# Patient Record
Sex: Male | Born: 1965 | Race: Black or African American | Hispanic: No | Marital: Married | Smoking: Never smoker
Health system: Southern US, Community
[De-identification: ages and names within clinical notes are randomized; demographics above are authoritative.]

## PROBLEM LIST (undated history)

## (undated) DIAGNOSIS — E7800 Pure hypercholesterolemia, unspecified: Secondary | ICD-10-CM

## (undated) HISTORY — PX: OTHER SURGICAL HISTORY: SHX169

---

## 2004-03-25 ENCOUNTER — Emergency Department (HOSPITAL_COMMUNITY): Admission: EM | Admit: 2004-03-25 | Discharge: 2004-03-25 | Payer: Self-pay | Admitting: *Deleted

## 2004-03-26 ENCOUNTER — Ambulatory Visit (HOSPITAL_COMMUNITY): Admission: RE | Admit: 2004-03-26 | Discharge: 2004-03-26 | Payer: Self-pay | Admitting: Urology

## 2004-10-27 ENCOUNTER — Emergency Department (HOSPITAL_COMMUNITY): Admission: EM | Admit: 2004-10-27 | Discharge: 2004-10-27 | Payer: Self-pay | Admitting: Emergency Medicine

## 2004-11-05 ENCOUNTER — Emergency Department (HOSPITAL_COMMUNITY): Admission: EM | Admit: 2004-11-05 | Discharge: 2004-11-05 | Payer: Self-pay | Admitting: Emergency Medicine

## 2005-08-26 ENCOUNTER — Ambulatory Visit (HOSPITAL_COMMUNITY): Admission: RE | Admit: 2005-08-26 | Discharge: 2005-08-26 | Payer: Self-pay | Admitting: Family Medicine

## 2007-02-18 ENCOUNTER — Emergency Department (HOSPITAL_COMMUNITY): Admission: EM | Admit: 2007-02-18 | Discharge: 2007-02-18 | Payer: Self-pay | Admitting: Emergency Medicine

## 2009-01-25 ENCOUNTER — Ambulatory Visit (HOSPITAL_COMMUNITY): Admission: RE | Admit: 2009-01-25 | Discharge: 2009-01-25 | Payer: Self-pay | Admitting: Internal Medicine

## 2011-03-06 LAB — BASIC METABOLIC PANEL
BUN: 12
CO2: 29
Calcium: 9.3
Chloride: 101
Creatinine, Ser: 1.21
GFR calc Af Amer: >60
GFR calc non Af Amer: >60
Glucose, Bld: 94
Potassium: 3.7
Sodium: 137

## 2011-03-06 LAB — POCT CARDIAC MARKERS
CKMB, poc: 1.1
CKMB, poc: 1.6
Myoglobin, poc: 104
Myoglobin, poc: 66.7
Operator id: 211291
Operator id: 211291
Troponin i, poc: <0.05
Troponin i, poc: <0.05

## 2011-12-29 ENCOUNTER — Ambulatory Visit (INDEPENDENT_AMBULATORY_CARE_PROVIDER_SITE_OTHER): Payer: BC Managed Care – PPO | Admitting: Gastroenterology

## 2011-12-29 ENCOUNTER — Other Ambulatory Visit: Payer: Self-pay | Admitting: Gastroenterology

## 2011-12-29 ENCOUNTER — Encounter: Payer: Self-pay | Admitting: Gastroenterology

## 2011-12-29 VITALS — BP 109/68 | HR 56 | Temp 98.4°F | Ht 64.0 in | Wt 161.6 lb

## 2011-12-29 DIAGNOSIS — K625 Hemorrhage of anus and rectum: Secondary | ICD-10-CM

## 2011-12-29 MED ORDER — PEG 3350-KCL-NA BICARB-NACL 420 G PO SOLR: ORAL | Status: AC

## 2011-12-29 NOTE — Progress Notes (Signed)
Referring Provider: Colette Ribas, MD Primary Care Physician:  Colette Ribas, MD Primary Gastroenterologist:  Dr. Darrick Penna   Chief Complaint  Patient presents with  . Rectal Bleeding    HPI:   Pleasant 46 year old male presenting for initial screening colonoscopy at request of Dwyane Luo, PA/Dr. Phillips Odor. Routine well exam through PCP with findings of heme +stool. Noticed paper hematochezia in past. Recently noticed moderate amount hematochezia in toilet, paper. Unsure if any rectal pain, discomfort when it occurred. No abdominal pain. Denies change in bowel habits. Drinks coffee in mornings, which helps. BM usually daily. No constipation or diarrhea. No weight loss. No loss of appetite. No FH of colon cancer. No upper GI symptoms.    No past medical history on file. (NO PERTINENT PMH)  Past Surgical History  Procedure Date  . Lipoma removal     Current Outpatient Prescriptions  Medication Sig Dispense Refill  . chlorpheniramine-HYDROcodone (TUSSIONEX) 10-8 MG/5ML LQCR Take 5 mLs by mouth at bedtime as needed.       . polyethylene glycol-electrolytes (TRILYTE) 420 G solution Use as directed Also buy 1 fleet enema & 4 dulcolax tablets to use as directed  4000 mL  0    Allergies as of 12/29/2011  . (No Known Allergies)    Family History  Problem Relation Age of Onset  . Colon cancer Neg Hx     History   Social History  . Marital Status: Married    Spouse Name: N/A    Number of Children: N/A  . Years of Education: N/A   Occupational History  .  [employer]   Social History Main Topics  . Smoking status: Never Smoker   . Smokeless tobacco: Not on file  . Alcohol Use: No  . Drug Use: No  . Sexually Active: Not on file   Other Topics Concern  . Not on file   Social History Narrative  . No narrative on file    Review of Systems: Gen: Denies any fever, chills, loss of appetite, fatigue, weight loss. CV: Denies chest pain, heart palpitations,  syncope, peripheral edema. Resp: Denies shortness of breath with rest, cough, wheezing GI: Denies dysphagia or odynophagia. Denies hematemesis, fecal incontinence, or jaundice.  GU : Denies urinary burning, urinary frequency, urinary incontinence.  MS: Denies joint pain, muscle weakness, cramps, limited movement Derm: Denies rash, itching, dry skin Psych: Denies depression, anxiety, confusion or memory loss  Heme: Denies bruising, bleeding, and enlarged lymph nodes.  Physical Exam: BP 109/68  Pulse 56  Temp 98.4 F (36.9 C) (Temporal)  Ht 5\' 4"  (1.626 m)  Wt 161 lb 9.6 oz (73.301 kg)  BMI 27.74 kg/m2 General:   Alert and oriented. Well-developed, well-nourished, pleasant and cooperative. Head:  Normocephalic and atraumatic. Eyes:  Conjunctiva pink, sclera clear, no icterus.    Ears:  Normal auditory acuity. Nose:  No deformity, discharge,  or lesions. Mouth:  No deformity or lesions, mucosa pink and moist.  Neck:  Supple, without mass or thyromegaly. Lungs:  Clear to auscultation bilaterally, without wheezing, rales, or rhonchi.  Heart:  S1, S2 present without murmurs noted.  Abdomen:  +BS, soft, non-tender and non-distended. Without mass or HSM. No rebound or guarding. No hernias noted. Rectal:  Deferred  Msk:  Symmetrical without gross deformities. Normal posture. Extremities:  Without clubbing or edema. Neurologic:  Alert and  oriented x4;  grossly normal neurologically. Skin:  Intact, warm and dry without significant lesions or rashes Cervical Nodes:  No significant cervical adenopathy. Psych:  Alert and cooperative. Normal mood and affect.

## 2011-12-29 NOTE — Assessment & Plan Note (Signed)
46 year old male with recent low-volume hematochezia. Heme +stool at well visit through PCP. Denies any change in bowel habits, abdominal pain, wt loss, lack of appetite, constipation, diarrhea. No prior colonoscopy. No upper GI symptoms. Likely benign anorectal source. However, just slightly overdue for screening colonoscopy. No FH of colon cancer.   Proceed with colonoscopy with Dr. Darrick Penna in the near future. The risks, benefits, and alternatives have been discussed in detail with the patient. They state understanding and desire to proceed.  Also discussed with pt need for family members, siblings to begin screening at age 89 as well. He was unaware of this but appreciative.

## 2011-12-29 NOTE — Progress Notes (Signed)
Faxed to PCP

## 2011-12-29 NOTE — Patient Instructions (Addendum)
We have set you up for a colonoscopy with Dr. Fields in the near future. Further recommendations to follow once this is completed.  

## 2012-01-05 ENCOUNTER — Other Ambulatory Visit: Payer: Self-pay | Admitting: Gastroenterology

## 2012-01-05 DIAGNOSIS — K625 Hemorrhage of anus and rectum: Secondary | ICD-10-CM

## 2012-01-27 ENCOUNTER — Ambulatory Visit: Admit: 2012-01-27 | Payer: BC Managed Care – PPO | Admitting: Gastroenterology

## 2012-01-27 SURGERY — COLONOSCOPY
Anesthesia: Moderate Sedation

## 2012-01-30 ENCOUNTER — Encounter (HOSPITAL_COMMUNITY): Payer: Self-pay | Admitting: Pharmacy Technician

## 2012-02-10 ENCOUNTER — Encounter (HOSPITAL_COMMUNITY)
Admission: RE | Disposition: A | Discharge status: Home / Self Care | Payer: Self-pay | Source: Ambulatory Visit | Attending: Gastroenterology

## 2012-02-10 ENCOUNTER — Ambulatory Visit (HOSPITAL_COMMUNITY)
Admission: RE | Admit: 2012-02-10 | Discharge: 2012-02-10 | Disposition: A | Discharge status: Home / Self Care | Payer: BC Managed Care – PPO | Source: Ambulatory Visit | Attending: Gastroenterology | Admitting: Gastroenterology

## 2012-02-10 ENCOUNTER — Encounter (HOSPITAL_COMMUNITY): Payer: Self-pay | Admitting: *Deleted

## 2012-02-10 DIAGNOSIS — K625 Hemorrhage of anus and rectum: Secondary | ICD-10-CM | POA: Insufficient documentation

## 2012-02-10 DIAGNOSIS — K620 Anal polyp: Secondary | ICD-10-CM

## 2012-02-10 DIAGNOSIS — D126 Benign neoplasm of colon, unspecified: Secondary | ICD-10-CM | POA: Insufficient documentation

## 2012-02-10 DIAGNOSIS — K621 Rectal polyp: Secondary | ICD-10-CM

## 2012-02-10 DIAGNOSIS — K648 Other hemorrhoids: Secondary | ICD-10-CM | POA: Insufficient documentation

## 2012-02-10 HISTORY — PX: COLONOSCOPY: SHX5424

## 2012-02-10 SURGERY — COLONOSCOPY
Anesthesia: Moderate Sedation

## 2012-02-10 MED ORDER — STERILE WATER FOR IRRIGATION IR SOLN
Status: DC | PRN
  Administered 2012-02-10: 14:00:00

## 2012-02-10 MED ORDER — SODIUM CHLORIDE 0.9 % IV SOLN: INTRAVENOUS | Status: DC

## 2012-02-10 MED ORDER — SODIUM CHLORIDE 0.45 % IV SOLN
Freq: Once | INTRAVENOUS | Status: AC
  Administered 2012-02-10: 1000 mL via INTRAVENOUS

## 2012-02-10 MED ORDER — MEPERIDINE HCL 100 MG/ML IJ SOLN
INTRAMUSCULAR | Status: DC | PRN
  Administered 2012-02-10 (×2): 25 mg via INTRAVENOUS

## 2012-02-10 MED ORDER — MEPERIDINE HCL 100 MG/ML IJ SOLN
INTRAMUSCULAR | Status: AC
  Filled 2012-02-10: qty 2

## 2012-02-10 MED ORDER — MIDAZOLAM HCL 5 MG/5ML IJ SOLN
INTRAMUSCULAR | Status: AC
  Filled 2012-02-10: qty 10

## 2012-02-10 MED ORDER — MIDAZOLAM HCL 5 MG/5ML IJ SOLN
INTRAMUSCULAR | Status: DC | PRN
  Administered 2012-02-10 (×2): 2 mg via INTRAVENOUS

## 2012-02-10 NOTE — Op Note (Signed)
[facility] [address] [place], [address]   COLONOSCOPY PROCEDURE REPORT  PATIENT: Ricky Wade, Ricky Wade  MR#: [id number] BIRTHDATE: 20-Nov-1965 , 46  yrs. old GENDER: Male ENDOSCOPIST: Jonette Eva, MD REFERRED OZ:DGUY Phillips Odor, M.D. PROCEDURE DATE:  02/10/2012 PROCEDURE:   Colonoscopy with biopsy INDICATIONS:    rectal bleeding MEDICATIONS: Demerol 50 mg IV and Versed 4 mg IV  DESCRIPTION OF PROCEDURE:    Physical exam was performed.  Informed consent was obtained from the patient after explaining the benefits, risks, and alternatives to procedure.  The patient was connected to monitor and placed in left lateral position. Continuous oxygen was provided by nasal cannula and IV medicine administered through an indwelling cannula.  After administration of sedation and rectal exam, the patients rectum was intubated and the EC-3890Li ([id number])  colonoscope was advanced under direct visualization to the ileum.  The scope was removed slowly by carefully examining the color, texture, anatomy, and integrity mucosa on the way out.  The patient was recovered in endoscopy and discharged home in satisfactory condition.       COLON FINDINGS: Three sessile polyps ranging between 3-60mm in size were found in the descending colon.  A polypectomy was performed with cold forceps.  , The colon mucosa was otherwise normal.  , The mucosa appeared normal in the terminal ileum.  , and Small internal hemorrhoids were found.  PREP QUALITY: good. CECAL W/D TIME: 12 minutes  COMPLICATIONS: None  ENDOSCOPIC IMPRESSION: 1.   Three sessile polyps ranging between 3-52mm in size were found in the descending colon; polypectomy was performed with cold forceps 2.   The colon mucosa was otherwise normal 3.   Normal mucosa in the terminal ileum 4.   Small internal hemorrhoids-MOST LIKLEY SOURCE FOR RECTAL BLEEDING   RECOMMENDATIONS: FOLLOW A HIGH FIBER DIET.  AVOID ITEMS THAT CAUSE  BLOATING.  BIOPSY RESULTS SHOULD BE BACK IN 7 DAYS.  Next colonoscopy in 10 years.       _______________________________ Rosalie DoctorJonette Eva, MD 02/10/2012 2:58 PM     PATIENT NAME:  Ricky, Wade MR#: [id number]

## 2012-02-10 NOTE — H&P (Signed)
  Primary Care Physician:  Colette Ribas, MD Primary Gastroenterologist:  Dr. Darrick Penna  Pre-Procedure History & Physical: HPI:  Ricky Wade is a 46 y.o. male here for  BRBPR.    No past medical history on file.  Past Surgical History  Procedure Date  . Lipoma removal     Prior to Admission medications   Medication Sig Start Date End Date Taking? Authorizing Provider  aspirin EC 81 MG tablet Take 81 mg by mouth every other day.   Yes Historical Provider, MD    Allergies as of 01/05/2012  . (No Known Allergies)    Family History  Problem Relation Age of Onset  . Colon cancer Neg Hx     History   Social History  . Marital Status: Married    Spouse Name: N/A    Number of Children: N/A  . Years of Education: N/A   Occupational History  .  [employer]   Social History Main Topics  . Smoking status: Never Smoker   . Smokeless tobacco: Not on file  . Alcohol Use: No  . Drug Use: No  . Sexually Active: Not on file   Other Topics Concern  . Not on file   Social History Narrative  . No narrative on file    Review of Systems: See HPI, otherwise negative ROS   Physical Exam: BP 111/72  Pulse 51  Temp 98.2 F (36.8 C) (Oral)  Resp 16  Ht 5\' 5"  (1.651 m)  Wt 154 lb (69.854 kg)  BMI 25.63 kg/m2  SpO2 99% General:   Alert,  pleasant and cooperative in NAD Head:  Normocephalic and atraumatic. Neck:  Supple; Lungs:  Clear throughout to auscultation.    Heart:  Regular rate and rhythm. Abdomen:  Soft, nontender and nondistended. Normal bowel sounds, without guarding, and without rebound.   Neurologic:  Alert and  oriented x4;  grossly normal neurologically.  Impression/Plan:    BRBPR  PLAN: TCS TODAY

## 2012-02-12 ENCOUNTER — Encounter (HOSPITAL_COMMUNITY): Payer: Self-pay | Admitting: Gastroenterology

## 2012-02-16 ENCOUNTER — Telehealth: Payer: Self-pay | Admitting: Gastroenterology

## 2012-02-16 NOTE — Telephone Encounter (Signed)
Please call pt. He had simple adenoma removed from hIS colon.    FOLLOW A HIGH FIBER DIET. AVOID ITEMS THAT CAUSE BLOATING.   Next colonoscopy in 10 years.

## 2012-02-17 NOTE — Telephone Encounter (Signed)
Faxed to PCP, recall made  

## 2012-02-17 NOTE — Telephone Encounter (Signed)
Pt called back and is aware of results.  

## 2012-02-17 NOTE — Telephone Encounter (Signed)
LMOM for a return call.  

## 2012-02-18 ENCOUNTER — Telehealth: Payer: Self-pay

## 2012-02-18 NOTE — Telephone Encounter (Signed)
Pt returned my call. ( Although he had called back and Ginger gave him his results). Mailing a copy of high fiber diet to pt.

## 2012-02-26 NOTE — Progress Notes (Signed)
TCS SEP 2013 IH SIMPLE ADENOMA  REVIEWED.

## 2012-05-05 ENCOUNTER — Other Ambulatory Visit (HOSPITAL_COMMUNITY): Payer: Self-pay | Admitting: Physician Assistant

## 2012-05-05 DIAGNOSIS — R109 Unspecified abdominal pain: Secondary | ICD-10-CM

## 2012-05-07 ENCOUNTER — Ambulatory Visit (HOSPITAL_COMMUNITY): Payer: BC Managed Care – PPO

## 2012-05-12 ENCOUNTER — Ambulatory Visit (HOSPITAL_COMMUNITY)
Admission: RE | Admit: 2012-05-12 | Discharge: 2012-05-12 | Disposition: A | Discharge status: Home / Self Care | Payer: BC Managed Care – PPO | Source: Ambulatory Visit | Attending: Physician Assistant | Admitting: Physician Assistant

## 2012-05-12 DIAGNOSIS — R109 Unspecified abdominal pain: Secondary | ICD-10-CM | POA: Insufficient documentation

## 2012-05-12 DIAGNOSIS — R110 Nausea: Secondary | ICD-10-CM | POA: Insufficient documentation

## 2015-08-19 ENCOUNTER — Emergency Department (HOSPITAL_COMMUNITY): Payer: BLUE CROSS/BLUE SHIELD

## 2015-08-19 ENCOUNTER — Encounter (HOSPITAL_COMMUNITY): Payer: Self-pay

## 2015-08-19 ENCOUNTER — Emergency Department (HOSPITAL_COMMUNITY)
Admission: EM | Admit: 2015-08-19 | Discharge: 2015-08-19 | Disposition: A | Discharge status: Home / Self Care | Payer: BLUE CROSS/BLUE SHIELD | Attending: Emergency Medicine | Admitting: Emergency Medicine

## 2015-08-19 DIAGNOSIS — M542 Cervicalgia: Secondary | ICD-10-CM

## 2015-08-19 DIAGNOSIS — Y999 Unspecified external cause status: Secondary | ICD-10-CM | POA: Insufficient documentation

## 2015-08-19 DIAGNOSIS — M25559 Pain in unspecified hip: Secondary | ICD-10-CM | POA: Insufficient documentation

## 2015-08-19 DIAGNOSIS — S80211A Abrasion, right knee, initial encounter: Secondary | ICD-10-CM | POA: Diagnosis not present

## 2015-08-19 DIAGNOSIS — Y939 Activity, unspecified: Secondary | ICD-10-CM | POA: Diagnosis not present

## 2015-08-19 DIAGNOSIS — Y929 Unspecified place or not applicable: Secondary | ICD-10-CM | POA: Insufficient documentation

## 2015-08-19 DIAGNOSIS — M25561 Pain in right knee: Secondary | ICD-10-CM

## 2015-08-19 MED ORDER — IBUPROFEN 600 MG PO TABS: 600.0000 mg | ORAL_TABLET | Freq: Three times a day (TID) | ORAL | Status: DC | PRN

## 2015-08-19 MED ORDER — CYCLOBENZAPRINE HCL 10 MG PO TABS: 10.0000 mg | ORAL_TABLET | Freq: Three times a day (TID) | ORAL | Status: DC | PRN

## 2015-08-19 MED ORDER — IBUPROFEN 400 MG PO TABS
600.0000 mg | ORAL_TABLET | Freq: Once | ORAL | Status: AC
  Administered 2015-08-19: 600 mg via ORAL
  Filled 2015-08-19: qty 2
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
## 2015-08-19 NOTE — Discharge Instructions (Signed)
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
## 2016-10-05 ENCOUNTER — Encounter (HOSPITAL_COMMUNITY): Payer: Self-pay | Admitting: *Deleted

## 2016-10-05 ENCOUNTER — Emergency Department (HOSPITAL_COMMUNITY)
Admission: EM | Admit: 2016-10-05 | Discharge: 2016-10-05 | Disposition: A | Discharge status: Home / Self Care | Payer: BLUE CROSS/BLUE SHIELD | Attending: Emergency Medicine | Admitting: Emergency Medicine

## 2016-10-05 ENCOUNTER — Emergency Department (HOSPITAL_COMMUNITY): Payer: BLUE CROSS/BLUE SHIELD

## 2016-10-05 DIAGNOSIS — Z7982 Long term (current) use of aspirin: Secondary | ICD-10-CM | POA: Insufficient documentation

## 2016-10-05 DIAGNOSIS — Z79899 Other long term (current) drug therapy: Secondary | ICD-10-CM | POA: Insufficient documentation

## 2016-10-05 DIAGNOSIS — M722 Plantar fascial fibromatosis: Secondary | ICD-10-CM | POA: Diagnosis not present

## 2016-10-05 DIAGNOSIS — M79672 Pain in left foot: Secondary | ICD-10-CM | POA: Diagnosis present

## 2016-10-05 MED ORDER — NAPROXEN 500 MG PO TABS: 500.0000 mg | ORAL_TABLET | Freq: Two times a day (BID) | ORAL | 0 refills | Status: DC

## 2016-10-05 NOTE — ED Provider Notes (Signed)
[facility] DEPT Provider Note   CSN: [id number] Arrival date & time: 10/05/16  0913     History   Chief Complaint Chief Complaint  Patient presents with  . Foot Pain    HPI Ricky Wade is a 51 y.o. male presenting with chronic left heel pain which has been present for the past several weeks. He denies injury but stands for 12 hours at work wearing steel toe work boots.  His pain is worsened in the morning, gets some better as he walks but does not resolve despite biofreeze and epsom salt soaks.  There is occasional radiation of pain into his upper calf with movement.  He denies swelling, redness, rash, trauma.  The history is provided by the patient.    History reviewed. No pertinent past medical history.  Patient Active Problem List   Diagnosis Date Noted  . Rectal bleeding 12/29/2011    Past Surgical History:  Procedure Laterality Date  . COLONOSCOPY  02/10/2012   Procedure: COLONOSCOPY;  Surgeon: Danie Binder, MD;  Location: [facility];  Service: Endoscopy;  Laterality: N/A;  1:10  . lipoma removal         Home Medications    Prior to Admission medications   Medication Sig Start Date End Date Taking? Authorizing Provider  aspirin EC 81 MG tablet Take 81 mg by mouth every other day.    [provider]  cyclobenzaprine (FLEXERIL) 10 MG tablet Take 1 tablet (10 mg total) by mouth 3 (three) times daily as needed for muscle spasms. 08/19/15   Jola Schmidt, MD  ibuprofen (ADVIL,MOTRIN) 600 MG tablet Take 1 tablet (600 mg total) by mouth every 8 (eight) hours as needed. 08/19/15   Jola Schmidt, MD  naproxen (NAPROSYN) 500 MG tablet Take 1 tablet (500 mg total) by mouth 2 (two) times daily. 10/05/16   Evalee Jefferson, PA-C    Family History Family History  Problem Relation Age of Onset  . Colon cancer Neg Hx     Social History Social History  Substance Use Topics  . Smoking status: Never Smoker  . Smokeless tobacco: Never Used  . Alcohol use No      Allergies   Patient has no known allergies.   Review of Systems Review of Systems  Constitutional: Negative for fever.  Musculoskeletal: Positive for arthralgias. Negative for joint swelling and myalgias.  Neurological: Negative for weakness and numbness.     Physical Exam Updated Vital Signs BP 110/78 (BP Location: Right Arm)   Pulse (!) 51   Temp 98.1 F (36.7 C) (Oral)   Resp 18   Ht 5\' 6"  (1.676 m)   Wt 74.8 kg   SpO2 100%   BMI 26.63 kg/m   Physical Exam  Constitutional: He appears well-developed and well-nourished.  HENT:  Head: Atraumatic.  Neck: Normal range of motion.  Cardiovascular:  Pulses equal bilaterally  Musculoskeletal: He exhibits tenderness.       Left foot: There is bony tenderness. There is no swelling, normal capillary refill, no crepitus and no deformity.       Feet:  No erythema, edema, skin is intact.  Negative thompson test, calf, ankle and dorsal foot nontender.  Neurological: He is alert. He has normal strength. He displays normal reflexes. No sensory deficit.  Skin: Skin is warm and dry.  Psychiatric: He has a normal mood and affect.     ED Treatments / Results  Labs (all labs ordered are listed, but only abnormal results are  displayed) Labs Reviewed - No data to display  EKG  EKG Interpretation None       Radiology Dg Foot Complete Left  Result Date: 10/05/2016 CLINICAL DATA:  Left heel pain for 1 month, no injury EXAM: LEFT FOOT - COMPLETE 3+ VIEW COMPARISON:  None. FINDINGS: Osseous alignment is normal. No fracture line or displaced fracture fragment. No acute or suspicious osseous lesion. Overall bone mineralization is normal. No significant degenerative change. Small enthesophyte at the plantar margin of the posterior calcaneus. IMPRESSION: 1. No acute findings. 2. Small spur/enthesophyte at the plantar margin of the posterior calcaneus. Electronically Signed   By: Franki Cabot M.D.   On: 10/05/2016 10:40     Procedures Procedures (including critical care time)  Medications Ordered in ED Medications - No data to display   Initial Impression / Assessment and Plan / ED Course  I have reviewed the triage vital signs and the nursing notes.  Pertinent labs & imaging results that were available during my care of the patient were reviewed by me and considered in my medical decision making (see chart for details).     Suspect plantar fasciitis.  Small spurring on imaging noted.  Home tx instructions given, naproxen, f/u with pcp for a recheck within the next several weeks if not improving.  Final Clinical Impressions(s) / ED Diagnoses   Final diagnoses:  Plantar fasciitis of left foot    New Prescriptions New Prescriptions   NAPROXEN (NAPROSYN) 500 MG TABLET    Take 1 tablet (500 mg total) by mouth 2 (two) times daily.     Evalee Jefferson, PA-C 10/05/16 1056    Mesner, Corene Cornea, MD 10/05/16 [phone]

## 2016-10-05 NOTE — ED Triage Notes (Addendum)
Pt c/o left foot pain that shoots up the leg that started several weeks ago. Pt denies injury. Pt ambulatory in triage, but limping on left foot. Pt has used Epsom salt soaks and Biofreeze with no relief of pain.

## 2016-10-05 NOTE — ED Notes (Signed)
Pt states that pain is in the bottom of the left foot near his heel.  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

## 2016-10-13 ENCOUNTER — Emergency Department (HOSPITAL_COMMUNITY): Payer: BLUE CROSS/BLUE SHIELD

## 2016-10-13 ENCOUNTER — Emergency Department (HOSPITAL_COMMUNITY)
Admission: EM | Admit: 2016-10-13 | Discharge: 2016-10-13 | Disposition: A | Discharge status: Home / Self Care | Payer: BLUE CROSS/BLUE SHIELD | Attending: Emergency Medicine | Admitting: Emergency Medicine

## 2016-10-13 ENCOUNTER — Encounter (HOSPITAL_COMMUNITY): Payer: Self-pay | Admitting: *Deleted

## 2016-10-13 DIAGNOSIS — Z7982 Long term (current) use of aspirin: Secondary | ICD-10-CM | POA: Insufficient documentation

## 2016-10-13 DIAGNOSIS — Z79899 Other long term (current) drug therapy: Secondary | ICD-10-CM | POA: Diagnosis not present

## 2016-10-13 DIAGNOSIS — M25562 Pain in left knee: Secondary | ICD-10-CM | POA: Diagnosis not present

## 2016-10-13 MED ORDER — ACETAMINOPHEN 500 MG PO TABS
1000.0000 mg | ORAL_TABLET | Freq: Once | ORAL | Status: AC
Start: 2016-10-13 — End: 2016-10-13
  Administered 2016-10-13: 1000 mg via ORAL
  Filled 2016-10-13: qty 2

## 2016-10-13 NOTE — ED Provider Notes (Signed)
[facility] DEPT Provider Note   CSN: [id number] Arrival date & time: 10/13/16  0203     History   Chief Complaint Chief Complaint  Patient presents with  . Leg Pain    HPI CHRISTINO MCGLINCHEY is a 51 y.o. male.  HPI  51 year old male presents with left medial knee pain. He states his been on and off for a couple weeks. He was recently diagnosed with plantar fasciitis and states that pain has improved after Naprosyn. However tonight the knee pain seemed to be worse. Certain movements or positions make it worse. He has notany thigh swelling or calf swelling. He is able to ambulate on it. Tried icing it prior to coming into the ER.  History reviewed. No pertinent past medical history.  Patient Active Problem List   Diagnosis Date Noted  . Rectal bleeding 12/29/2011    Past Surgical History:  Procedure Laterality Date  . COLONOSCOPY  02/10/2012   Procedure: COLONOSCOPY;  Surgeon: Danie Binder, MD;  Location: [facility];  Service: Endoscopy;  Laterality: N/A;  1:10  . lipoma removal         Home Medications    Prior to Admission medications   Medication Sig Start Date End Date Taking? Authorizing Provider  aspirin EC 81 MG tablet Take 81 mg by mouth every other day.    [provider]  cyclobenzaprine (FLEXERIL) 10 MG tablet Take 1 tablet (10 mg total) by mouth 3 (three) times daily as needed for muscle spasms. 08/19/15   Jola Schmidt, MD  ibuprofen (ADVIL,MOTRIN) 600 MG tablet Take 1 tablet (600 mg total) by mouth every 8 (eight) hours as needed. 08/19/15   Jola Schmidt, MD  naproxen (NAPROSYN) 500 MG tablet Take 1 tablet (500 mg total) by mouth 2 (two) times daily. 10/05/16   Evalee Jefferson, PA-C    Family History Family History  Problem Relation Age of Onset  . Colon cancer Neg Hx     Social History Social History  Substance Use Topics  . Smoking status: Never Smoker  . Smokeless tobacco: Never Used  . Alcohol use No     Allergies   Patient has  no known allergies.   Review of Systems Review of Systems  Constitutional: Negative for fever.  Musculoskeletal: Positive for arthralgias. Negative for joint swelling.  Neurological: Negative for weakness and numbness.  All other systems reviewed and are negative.    Physical Exam Updated Vital Signs BP 115/79 (BP Location: Left Arm)   Pulse 67   Temp 97.8 F (36.6 C) (Oral)   Resp 16   Ht 5\' 6"  (7.412 m)   Wt 74.8 kg (165 lb)   SpO2 98%   BMI 26.63 kg/m   Physical Exam  Constitutional: He is oriented to person, place, and time. He appears well-developed and well-nourished.  HENT:  Head: Normocephalic and atraumatic.  Right Ear: External ear normal.  Left Ear: External ear normal.  Nose: Nose normal.  Eyes: Right eye exhibits no discharge. Left eye exhibits no discharge.  Neck: Neck supple.  Cardiovascular: Normal rate and regular rhythm.   Pulses:      Dorsalis pedis pulses are 2+ on the left side.  Pulmonary/Chest: Effort normal.  Abdominal: He exhibits no distension.  Musculoskeletal: He exhibits no edema.       Left knee: He exhibits normal range of motion, no swelling, no effusion, no erythema and normal alignment. Tenderness found. Medial joint line tenderness noted.  Left upper leg: He exhibits no tenderness and no swelling.       Left lower leg: He exhibits no tenderness and no swelling.       Legs: Neurological: He is alert and oriented to person, place, and time.  Skin: Skin is warm and dry.  Nursing note and vitals reviewed.    ED Treatments / Results  Labs (all labs ordered are listed, but only abnormal results are displayed) Labs Reviewed - No data to display  EKG  EKG Interpretation None       Radiology Dg Knee Complete 4 Views Left  Result Date: 10/13/2016 CLINICAL DATA:  Medial left knee pain. Pain for weeks. No known injury. EXAM: LEFT KNEE - COMPLETE 4+ VIEW COMPARISON:  None. FINDINGS: No evidence of fracture, dislocation, or  joint effusion. Bipartite patella, incidentally noted. The joint spaces are preserved. Trace spurring of the medial tibiofemoral joint space and inferior patella. Soft tissues are unremarkable. IMPRESSION: 1. No acute osseous abnormality. 2. Minimal osteoarthritis. 3. Bipartite patella, incidentally noted. Electronically Signed   By: Jeb Levering M.D.   On: 10/13/2016 05:35    Procedures Procedures (including critical care time)  Medications Ordered in ED Medications  acetaminophen (TYLENOL) tablet 1,000 mg (1,000 mg Oral Given 10/13/16 0454)     Initial Impression / Assessment and Plan / ED Course  I have reviewed the triage vital signs and the nursing notes.  Pertinent labs & imaging results that were available during my care of the patient were reviewed by me and considered in my medical decision making (see chart for details).     Given location, probably a tendonitis or bursitis. Is on nsaids. Continue this, ice, rest, elevate. No calf/thigh pain or swelling to suggest clot. NV intact. Ambulating normally in ED. No significant trauma to suggest ligamentous injury. F/u with orthopedics.  Final Clinical Impressions(s) / ED Diagnoses   Final diagnoses:  Left medial knee pain    New Prescriptions Discharge Medication List as of 10/13/2016  5:43 AM       Sherwood Gambler, MD 10/13/16 1439

## 2016-10-13 NOTE — ED Triage Notes (Signed)
Pt c/o left leg pain that woke him up pta; pt denies any injury

## 2016-10-23 ENCOUNTER — Ambulatory Visit (INDEPENDENT_AMBULATORY_CARE_PROVIDER_SITE_OTHER): Payer: BLUE CROSS/BLUE SHIELD | Admitting: Orthopaedic Surgery

## 2016-10-23 ENCOUNTER — Encounter: Payer: Self-pay | Admitting: Orthopaedic Surgery

## 2016-10-23 VITALS — BP 120/77 | HR 60 | Temp 97.9°F | Ht 69.0 in | Wt 163.0 lb

## 2016-10-23 DIAGNOSIS — M25562 Pain in left knee: Secondary | ICD-10-CM

## 2016-10-23 NOTE — Progress Notes (Signed)
Subjective:    Patient ID: Ricky Wade, male    DOB: 01-12-1966, 51 y.o.   MRN: [id number]  HPI He has had pain in the left knee area for about six to eight weeks.  He has been treated for plantar fascitis of the left foot. He has been limping.  He has gotten inserts for the shoe and has begun naprosyn.  He has used a brace for the knee also.  He is much improved.  He has little pain of the right knee now.  He has no swelling no giving way.  He is walking well now.   Review of Systems  HENT: Negative for congestion.   Respiratory: Negative for cough and shortness of breath.   Cardiovascular: Negative for chest pain and leg swelling.  Endocrine: Negative for cold intolerance.  Musculoskeletal: Positive for arthralgias.  Allergic/Immunologic: Negative for environmental allergies.   History reviewed. No pertinent past medical history.  Past Surgical History:  Procedure Laterality Date  . COLONOSCOPY  02/10/2012   Procedure: COLONOSCOPY;  Surgeon: Danie Binder, MD;  Location: [facility];  Service: Endoscopy;  Laterality: N/A;  1:10  . lipoma removal      Current Outpatient Prescriptions on File Prior to Visit  Medication Sig Dispense Refill  . naproxen (NAPROSYN) 500 MG tablet Take 1 tablet (500 mg total) by mouth 2 (two) times daily. 30 tablet 0   No current facility-administered medications on file prior to visit.     Social History   Social History  . Marital status: Married    Spouse name: N/A  . Number of children: N/A  . Years of education: N/A   Occupational History  .  [employer] History Main Topics  . Smoking status: Never Smoker  . Smokeless tobacco: Never Used  . Alcohol use No  . Drug use: No  . Sexual activity: Not on file   Other Topics Concern  . Not on file   Social History Narrative  . No narrative on file    Family History  Problem Relation Age of Onset  . Diabetes Mother   . Diabetes Father   . Colon cancer  Neg Hx     BP 120/77   Pulse 60   Temp 97.9 F (36.6 C)   Ht 5\' 9"  (1.753 m)   Wt 163 lb (73.9 kg)   BMI 24.07 kg/m      Objective:   Physical Exam  Constitutional: He is oriented to person, place, and time. He appears well-developed and well-nourished.  HENT:  Head: Normocephalic and atraumatic.  Eyes: Conjunctivae and EOM are normal. Pupils are equal, round, and reactive to light.  Neck: Normal range of motion. Neck supple.  Cardiovascular: Normal rate, regular rhythm and intact distal pulses.   Pulmonary/Chest: Effort normal.  Abdominal: Soft.  Musculoskeletal: Normal range of motion.  Normal exam of the left knee with no pain, no effusion, normal gait.  NV intact.   Neurological: He is alert and oriented to person, place, and time. He has normal reflexes. He displays normal reflexes. No cranial nerve deficit. He exhibits normal muscle tone. Coordination normal.  Skin: Skin is warm and dry.  Psychiatric: He has a normal mood and affect. His behavior is normal. Judgment and thought content normal.  Vitals reviewed.  Encounter Diagnosis  Name Primary?  . Acute pain of left knee Yes   I will see him as needed. He is much better.  He has no problem today.  Call if any problem.  Electronically Signed Sanjuana Kava, MD 5/31/201810:37 AM        I will

## 2017-09-13 ENCOUNTER — Other Ambulatory Visit: Payer: Self-pay

## 2017-09-13 ENCOUNTER — Emergency Department (HOSPITAL_COMMUNITY): Payer: BLUE CROSS/BLUE SHIELD

## 2017-09-13 ENCOUNTER — Encounter (HOSPITAL_COMMUNITY): Payer: Self-pay | Admitting: Emergency Medicine

## 2017-09-13 ENCOUNTER — Emergency Department (HOSPITAL_COMMUNITY)
Admission: EM | Admit: 2017-09-13 | Discharge: 2017-09-13 | Disposition: A | Discharge status: Home / Self Care | Payer: BLUE CROSS/BLUE SHIELD | Attending: Emergency Medicine | Admitting: Emergency Medicine

## 2017-09-13 DIAGNOSIS — R42 Dizziness and giddiness: Secondary | ICD-10-CM | POA: Insufficient documentation

## 2017-09-13 LAB — I-STAT CHEM 8, ED
BUN: 7 mg/dL (ref 6–20)
CHLORIDE: 102 mmol/L (ref 101–111)
CREATININE: 1.2 mg/dL (ref 0.61–1.24)
Calcium, Ion: 1.23 mmol/L (ref 1.15–1.40)
GLUCOSE: 87 mg/dL (ref 65–99)
HCT: 41 % (ref 39.0–52.0)
Hemoglobin: 13.9 g/dL (ref 13.0–17.0)
POTASSIUM: 4.1 mmol/L (ref 3.5–5.1)
Sodium: 140 mmol/L (ref 135–145)
TCO2: 26 mmol/L (ref 22–32)

## 2017-09-13 MED ORDER — MECLIZINE HCL 12.5 MG PO TABS
ORAL_TABLET | ORAL | Status: AC
  Administered 2017-09-13: 25 mg via ORAL
  Filled 2017-09-13: qty 2

## 2017-09-13 MED ORDER — MECLIZINE HCL 12.5 MG PO TABS
25.0000 mg | ORAL_TABLET | Freq: Once | ORAL | Status: AC
  Administered 2017-09-13: 25 mg via ORAL

## 2017-09-13 MED ORDER — MECLIZINE HCL 25 MG PO TABS: 25.0000 mg | ORAL_TABLET | Freq: Three times a day (TID) | ORAL | 0 refills | Status: DC | PRN

## 2017-09-13 NOTE — ED Provider Notes (Signed)
[facility] EMERGENCY DEPARTMENT Provider Note   CSN: [id number] Arrival date & time: 09/13/17  1508     History   Chief Complaint Chief Complaint  Patient presents with  . Dizziness    HPI Ricky Wade is a 52 y.o. male.  The history is provided by the patient and the spouse.  Dizziness  Quality:  Head spinning, room spinning and vertigo Severity:  Moderate Onset quality:  Sudden Duration:  2 months Timing:  Intermittent Progression:  Worsening Chronicity:  New Context: bending over, head movement and standing up   Relieved by:  Being still and change in position Associated symptoms: no blood in stool, no chest pain, no palpitations, no shortness of breath, no vision changes and no weakness     History reviewed. No pertinent past medical history.  Patient Active Problem List   Diagnosis Date Noted  . Rectal bleeding 12/29/2011    Past Surgical History:  Procedure Laterality Date  . COLONOSCOPY  02/10/2012   Procedure: COLONOSCOPY;  Surgeon: Danie Binder, MD;  Location: [facility];  Service: Endoscopy;  Laterality: N/A;  1:10  . lipoma removal          Home Medications    Prior to Admission medications   Medication Sig Start Date End Date Taking? Authorizing Provider  meclizine (ANTIVERT) 25 MG tablet Take 1 tablet (25 mg total) by mouth 3 (three) times daily as needed for dizziness. 09/13/17   Tabathia Knoche, Corene Cornea, MD    Family History Family History  Problem Relation Age of Onset  . Diabetes Mother   . Diabetes Father   . Colon cancer Neg Hx     Social History Social History   Tobacco Use  . Smoking status: Never Smoker  . Smokeless tobacco: Never Used  Substance Use Topics  . Alcohol use: No  . Drug use: No     Allergies   Patient has no known allergies.   Review of Systems Review of Systems  Respiratory: Negative for shortness of breath.   Cardiovascular: Negative for chest pain and palpitations.  Gastrointestinal: Negative for  blood in stool.  Neurological: Positive for dizziness. Negative for weakness.  All other systems reviewed and are negative.    Physical Exam Updated Vital Signs BP 114/80   Pulse 63   Temp 97.8 F (36.6 C) (Oral)   Resp 16   Ht 5\' 6"  (1.676 m)   Wt 73.9 kg (163 lb)   SpO2 99%   BMI 26.31 kg/m   Physical Exam  Constitutional: He is oriented to person, place, and time. He appears well-developed and well-nourished.  HENT:  Head: Normocephalic and atraumatic.  Eyes: Conjunctivae and EOM are normal.  Neck: Normal range of motion.  Cardiovascular: Normal rate.  Pulmonary/Chest: Effort normal. No respiratory distress.  Abdominal: Soft. He exhibits no distension.  Musculoskeletal: Normal range of motion.  Neurological: He is alert and oriented to person, place, and time.  No altered mental status, able to give full seemingly accurate history.  Face is symmetric, EOM's intact, pupils equal and reactive, vision intact, tongue and uvula midline without deviation. Upper and Lower extremity motor 5/5, intact pain perception in distal extremities, 2+ reflexes in biceps, patella and achilles tendons. Able to perform finger to nose normal with both hands. Walks without assistance or evident ataxia.    Skin: Skin is warm and dry.  Nursing note and vitals reviewed.    ED Treatments / Results  Labs (all labs ordered are listed,  but only abnormal results are displayed) Labs Reviewed  I-STAT CHEM 8, ED    EKG None  Radiology Ct Head Wo Contrast  Result Date: 09/13/2017 CLINICAL DATA:  Dizziness for 2 days EXAM: CT HEAD WITHOUT CONTRAST TECHNIQUE: Contiguous axial images were obtained from the base of the skull through the vertex without intravenous contrast. COMPARISON:  02/18/2007 FINDINGS: Brain: No evidence of acute infarction, hemorrhage, hydrocephalus, extra-axial collection or mass lesion/mass effect. Vascular: No hyperdense vessel or unexpected calcification. Skull: Normal.  Negative for fracture or focal lesion. Sinuses/Orbits: No acute finding. Other: None. IMPRESSION: Normal head CT Electronically Signed   By: Inez Catalina M.D.   On: 09/13/2017 16:43    Procedures Procedures (including critical care time)  Medications Ordered in ED Medications - No data to display   Initial Impression / Assessment and Plan / ED Course  I have reviewed the triage vital signs and the nursing notes.  Pertinent labs & imaging results that were available during my care of the patient were reviewed by me and considered in my medical decision making (see chart for details).    I suspect a likely peripheral cause for his vertigo most likely otolith without any other supporting symptoms suggesting otherwise.  Will DC with Epley maneuvers, meclizine.  He already has neurology follow-up and primary care follow-up.   Final Clinical Impressions(s) / ED Diagnoses   Final diagnoses:  Vertigo    ED Discharge Orders        Ordered    meclizine (ANTIVERT) 25 MG tablet  3 times daily PRN     09/13/17 1653       Bennet Kujawa, Corene Cornea, MD 09/13/17 1656

## 2017-09-13 NOTE — ED Notes (Signed)
Pt c/o dizziness off and on for 2 months, had some left arm/hand tingling 2 months ago that lasted for 2 days, denies any congestion, denies N/V.

## 2017-09-13 NOTE — ED Triage Notes (Signed)
Pt reports intermittent dizziness since Friday.  States bending over makes it significantly worse.

## 2017-09-13 NOTE — ED Triage Notes (Signed)
Pt states he did hit his forehead on his truck mirror 3 weeks ago quite hard.

## 2017-09-24 ENCOUNTER — Encounter: Payer: Self-pay | Admitting: Neurology

## 2017-09-24 ENCOUNTER — Ambulatory Visit: Payer: BLUE CROSS/BLUE SHIELD | Admitting: Neurology

## 2017-09-24 VITALS — BP 113/72 | HR 60 | Ht 66.0 in | Wt 160.0 lb

## 2017-09-24 DIAGNOSIS — M530 Cervicocranial syndrome: Secondary | ICD-10-CM | POA: Diagnosis not present

## 2017-09-24 DIAGNOSIS — F418 Other specified anxiety disorders: Secondary | ICD-10-CM

## 2017-09-24 DIAGNOSIS — R42 Dizziness and giddiness: Secondary | ICD-10-CM | POA: Insufficient documentation

## 2017-09-24 DIAGNOSIS — M542 Cervicalgia: Secondary | ICD-10-CM | POA: Diagnosis not present

## 2017-09-24 DIAGNOSIS — H8309 Labyrinthitis, unspecified ear: Secondary | ICD-10-CM | POA: Insufficient documentation

## 2017-09-24 NOTE — Patient Instructions (Signed)
How to Perform the Epley Maneuver The Epley maneuver is an exercise that relieves symptoms of vertigo. Vertigo is the feeling that you or your surroundings are moving when they are not. When you feel vertigo, you may feel like the room is spinning and have trouble walking. Dizziness is a little different than vertigo. When you are dizzy, you may feel unsteady or light-headed. You can do this maneuver at home whenever you have symptoms of vertigo. You can do it up to 3 times a day until your symptoms go away. Even though the Epley maneuver may relieve your vertigo for a few weeks, it is possible that your symptoms will return. This maneuver relieves vertigo, but it does not relieve dizziness. What are the risks? If it is done correctly, the Epley maneuver is considered safe. Sometimes it can lead to dizziness or nausea that goes away after a short time. If you develop other symptoms, such as changes in vision, weakness, or numbness, stop doing the maneuver and call your health care provider. How to perform the Epley maneuver 1. Sit on the edge of a bed or table with your back straight and your legs extended or hanging over the edge of the bed or table. 2. Turn your head halfway toward the affected ear or side. 3. Lie backward quickly with your head turned until you are lying flat on your back. You may want to position a pillow under your shoulders. 4. Hold this position for 30 seconds. You may experience an attack of vertigo. This is normal. 5. Turn your head to the opposite direction until your unaffected ear is facing the floor. 6. Hold this position for 30 seconds. You may experience an attack of vertigo. This is normal. Hold this position until the vertigo stops. 7. Turn your whole body to the same side as your head. Hold for another 30 seconds. 8. Sit back up. You can repeat this exercise up to 3 times a day. Follow these instructions at home:  After doing the Epley maneuver, you can return to  your normal activities.  Ask your health care provider if there is anything you should do at home to prevent vertigo. He or she may recommend that you: ? Keep your head raised (elevated) with two or more pillows while you sleep. ? Do not sleep on the side of your affected ear. ? Get up slowly from bed. ? Avoid sudden movements during the day. ? Avoid extreme head movement, like looking up or bending over. Contact a health care provider if:  Your vertigo gets worse.  You have other symptoms, including: ? Nausea. ? Vomiting. ? Headache. Get help right away if:  You have vision changes.  You have a severe or worsening headache or neck pain.  You cannot stop vomiting.  You have new numbness or weakness in any part of your body. Summary  Vertigo is the feeling that you or your surroundings are moving when they are not.  The Epley maneuver is an exercise that relieves symptoms of vertigo.  If the Epley maneuver is done correctly, it is considered safe. You can do it up to 3 times a day. This information is not intended to replace advice given to you by your health care provider. Make sure you discuss any questions you have with your health care provider. Document Released: 05/17/2013 Document Revised: 04/01/2016 Document Reviewed: 04/01/2016 Elsevier Interactive Patient Education  2017 [place]. Vertigo Vertigo means that you feel like you are moving when  you are not. Vertigo can also make you feel like things around you are moving when they are not. This feeling can come and go at any time. Vertigo often goes away on its own. Follow these instructions at home:  Avoid making fast movements.  Avoid driving.  Avoid using heavy machinery.  Avoid doing any task or activity that might cause danger to you or other people if you would have a vertigo attack while you are doing it.  Sit down right away if you feel dizzy or have trouble with your balance.  Take over-the-counter  and prescription medicines only as told by your doctor.  Follow instructions from your doctor about which positions or movements you should avoid.  Drink enough fluid to keep your pee (urine) clear or pale yellow.  Keep all follow-up visits as told by your doctor. This is important. Contact a doctor if:  Medicine does not help your vertigo.  You have a fever.  Your problems get worse or you have new symptoms.  Your family or friends see changes in your behavior.  You feel sick to your stomach (nauseous) or you throw up (vomit).  You have a "pins and needles" feeling or you are numb in part of your body. Get help right away if:  You have trouble moving or talking.  You are always dizzy.  You pass out (faint).  You get very bad headaches.  You feel weak or have trouble using your hands, arms, or legs.  You have changes in your hearing.  You have changes in your seeing (vision).  You get a stiff neck.  Bright light starts to bother you. This information is not intended to replace advice given to you by your health care provider. Make sure you discuss any questions you have with your health care provider. Document Released: 02/19/2008 Document Revised: 10/18/2015 Document Reviewed: 09/04/2014 Elsevier Interactive Patient Education  [employer].

## 2017-09-24 NOTE — Progress Notes (Addendum)
SLEEP MEDICINE CLINIC   Provider:  Larey Seat, M D  Primary Care Physician:  [facility]   Referring Provider: [facility]   Patient is established with Dr. Merlene Laughter.     Chief Complaint  Patient presents with  . New Patient (Initial Visit)    pt alone, rm 10. pt has been complaining of some intermittent numbness starts in Lt side of head and the numbness will work its way down into the left hand. this is been goind on for a couple weeks. the pt states that he has been having heavy head feeling and dizziness and lightheaded. he was started on meclizine and he states that he helps but full dose can make him sleepy and he works at night with heavy machinery.     HPI:  Ricky [place] is a 52 y.o. male , seen here as in a referral for " heavy head " -  He reported dizziness / lightheadedness lasting for about 2 weeks before he presented to any pain emergency room.  The presumed diagnosis was vertigo as he describes the room spinning around him, it was a clockwise sensation, he noted his first when he slept on his left side.  After he rose from the bed and sat at the bedside for a while and wrapped his eyes he noticed that the room was spinning.   Apparently this  vertigo sensation lasted several days or 2 weeks by the time he finally presented to any pain emergency room where he was diagnosed with vertigo and treated with meclizine.  Meclizine has helped to suppress the spinning sensation at least however there is still this numb feeling in his face and some lightheadedness but also an electric shooting sensation as described below he also states that he is excessively daytime sleepy. Several months ago Ricky Wade had already an episode of vertigo and was evaluated by Dr. Merlene Laughter in [place] who did the Epley and Brandt-Daroff maneuvers with him.  Apparently he improved and for several weeks and months had no vertigo sensation until it returned earlier  last month.   I the pleasure of meeting with Mr. Gayler today on 09-24-2017, who reports no longer vertigo but burning, electric shock sensation - not a clear pain - The electric shock sensation would arise from the left face all the way through neck shoulder into the left upper extremity and reaches the fingers. The sensation affected his ability to sleep.   ER visit was unrelated to this cervicalgia , radiculopathy . He has seen ophthalmology, no abnormalities found there. He has an ENT physician - doesn't know his name. [place]    CLINICAL DATA:  Dizziness for 2 days  EXAM: CT HEAD WITHOUT CONTRAST  TECHNIQUE: Contiguous axial images were obtained from the base of the skull through the vertex without intravenous contrast.  COMPARISON:  02/18/2007  FINDINGS: Brain: No evidence of acute infarction, hemorrhage, hydrocephalus, extra-axial collection or mass lesion/mass effect.  Vascular: No hyperdense vessel or unexpected calcification.  Skull: Normal. Negative for fracture or focal lesion.  Sinuses/Orbits: No acute finding.  Other: None.  IMPRESSION: Normal head CT   Electronically Signed   By: Inez Catalina M.D.   On: 09/13/2017 16:43     Chief complaint according to patient : vertigo and facial numbness/ electric shock sensation in left upper extremity.   Medical history and family history: none  Social history: married, one daughter. No tobacco use , no ETOH use.  16  ounces of coffee, caffeine use - not soda and not iced tea, no energy drinks. 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    Review of Systems: Out of a complete 14 system review, the patient complains of only the following symptoms, and all other reviewed systems are negative.  vertigo, sinusitis,   Epworth score n/a  , Fatigue severity score N/a  , depression score. N/a    Social History   Socioeconomic History  . Marital status: Married    Spouse  name: Not on file  . Number of children: Not on file  . Years of education: Not on file  . Highest education level: Not on file  Occupational History    Employer: [employer]    Comment: [employer]  . Financial resource strain: Not on file  . Food insecurity:    Worry: Not on file    Inability: Not on file  . Transportation needs:    Medical: Not on file    Non-medical: Not on file  Tobacco Use  . Smoking status: Never Smoker  . Smokeless tobacco: Never Used  Substance and Sexual Activity  . Alcohol use: No  . Drug use: No  . Sexual activity: Not on file  Lifestyle  . Physical activity:    Days per week: Not on file    Minutes per session: Not on file  . Stress: Not on file  Relationships  . Social connections:    Talks on phone: Not on file    Gets together: Not on file    Attends religious service: Not on file    Active member of club or organization: Not on file    Attends meetings of clubs or organizations: Not on file    Relationship status: Not on file  . Intimate partner violence:    Fear of current or ex partner: Not on file    Emotionally abused: Not on file    Physically abused: Not on file    Forced sexual activity: Not on file  Other Topics Concern  . Not on file  Social History Narrative  . Not on file    Family History  Problem Relation Age of Onset  . Diabetes Mother   . Pancreatic cancer Mother   . Diabetes Father   . Dementia Father   . Colon cancer Neg Hx     No past medical history on file.  Past Surgical History:  Procedure Laterality Date  . COLONOSCOPY  02/10/2012   Procedure: COLONOSCOPY;  Surgeon: Danie Binder, MD;  [facility];  Service: Endoscopy;  Laterality: N/A;  1:10  . lipoma removal      Current Outpatient Medications  Medication Sig Dispense Refill  . meclizine (ANTIVERT) 25 MG tablet Take 1 tablet (25 mg total) by mouth 3 (three) times daily as needed for dizziness. 30 tablet 0   No current  facility-administered medications for this visit.     Allergies as of 09/24/2017  . (No Known Allergies)    Vitals: BP 113/72   Pulse 60   Ht 5\' 6"  (1.676 m)   Wt 160 lb (72.6 kg)   BMI 25.82 kg/m  Last Weight:  Wt Readings from Last 1 Encounters:  09/24/17 160 lb (72.6 kg)   JSE:GBTD mass index is 25.82 kg/m.     Last Height:   Ht Readings from Last 1 Encounters:  09/24/17 5\' 6"  (1.676 m)  Physical exam:  General: The patient is awake, alert and appears not in acute distress. The patient is well groomed. Head: Normocephalic, atraumatic. Neck is supple. Mallampati 3,  neck circumference: 16.5 . Nasal airflow patent , TMJ is evident . Retrognathia is seen.  Cardiovascular:  Regular rate and rhythm , without  murmurs or carotid bruit, and without distended neck veins. Respiratory: Lungs are clear to auscultation. Skin:  Without evidence of edema, or rash Trunk: BMI is normal . The patient's posture is erect.  Neurologic exam : The patient is awake and alert, oriented to place and time.   Memory subjective described as intact. Attention span & concentration ability appears normal.  Speech is fluent, without  dysarthria, dysphonia or aphasia.  Mood and affect are anxious.  Cranial nerves: Pupils are equal and briskly reactive to light. Funduscopic exam without  evidence of pallor or edema. Extraocular movements  in vertical and horizontal planes intact and without nystagmus. Visual fields by finger perimetry are intact. Hearing to finger rub intact. Completely impacted ear canal.  Facial sensation intact to fine touch. Facial motor strength is symmetric and tongue and uvula move midline. Shoulder shrug was symmetrical.   Motor exam:  Normal tone, muscle bulk and symmetric strength in all extremities. Weakness of right grip strength, mild. Sensory:  Fine touch, pinprick and vibration were tested in all extremities. Proprioception tested in the upper extremities was  normal. Coordination: Rapid alternating movements in the fingers/hands was normal. Finger-to-nose maneuver  normal without evidence of ataxia, dysmetria or tremor. Gait and station: Patient walks without assistive device and is able unassisted to climb up to the exam table. Strength within normal limits. Stance is stable and normal.   Deep tendon reflexes: in the  upper and lower extremities are symmetrically attenuated, failure of relaxation.     Assessment:  After physical and neurologic examination, review of laboratory studies,  Personal review of imaging studies, reports of other /same  Imaging studies, results of polysomnography and / or neurophysiology testing and pre-existing records as far as provided in visit., my assessment is   1) Mr. Westrup seems to have 2 independent conditions one is his vertigo which has been recurrent and was initially treated successfully by a Brandt-Daroff maneuver.  Risk factors for this are the environment he works in which is dusty, smoky, with fumes -which  also caused him to have some sinus problems, the other problem is the impacted ear canal.  I asked him to rinse with warm water, use a Q-tip that is either also moist or soaked in a oil such as olive oil.   I will refer him for vestibular rehab.  He still has meclizine available.  He indicated that sitting up was more likely to bring him vertigo and when he sleeps on his left side. CT of the head was negative for any stroke, tumor but of course cannot show US soft tissue changes.  2) more interestingly was a description of electric shock sensations that affect the left face and radiate all the way into the fingers of the left hand.  It seems to be the 3 middle fingers that are affected this could be related to a median truncus involvement.  He does not have weakness or persistent numbness in his hand and he has equal muscle mass tone and strength for his upper extremities, his shoulder shrug is normal.  For this  reason I will not order a nerve conduction study or EMG as it is very  unlikely to show any abnormality.  3) I discussed with the patient that his CT may not show reasons for vertigo that could be identified on an MRI-MRA.  He does not have strokelike symptoms no persistent weakness, no persistent numbness no slurring of speech or visual impairment.  Since he is quite not not acutely impaired I doubt that I will be able to get an MRI of the brain approved.  I encouraged him to call me if his symptoms worsen or become again present in that case I should be able to order an MRI of the cervical spine and brain.   The patient was advised of the nature of the diagnosed disorder , the treatment options and the  risks for general health and wellness arising from not treating the condition.   I spent more than 45 minutes of face to face time with the patient.  Greater than 50% of time was spent in counseling and coordination of care. We have discussed the diagnosis and differential and I answered the patient's questions.    Plan:  Treatment plan and additional workup :  Vestibular rehab , CT neck.  Ear care- remove impacted wax with syringe and Q tips.  If  Radiating pain returns, will order EMG and NCV, neck spine MRI .   Rv with NP.    Larey Seat, MD 1/0/0712, 1:97 PM  Certified in Neurology by ABPN Certified in [place] by [facility] [address], [place], [place] [address]

## 2017-09-28 ENCOUNTER — Telehealth: Payer: Self-pay | Admitting: Neurology

## 2017-09-28 NOTE — Telephone Encounter (Signed)
[employer]BS Josem Kaufmann: [id number] (exp. 09/28/17 to 10/27/17) patient is scheduled at GI for 10/07/17.

## 2017-10-07 ENCOUNTER — Ambulatory Visit
Admission: RE | Admit: 2017-10-07 | Discharge: 2017-10-07 | Disposition: A | Discharge status: Home / Self Care | Payer: BLUE CROSS/BLUE SHIELD | Source: Ambulatory Visit | Attending: Neurology | Admitting: Neurology

## 2017-10-07 ENCOUNTER — Other Ambulatory Visit: Payer: BLUE CROSS/BLUE SHIELD

## 2017-10-07 DIAGNOSIS — M542 Cervicalgia: Secondary | ICD-10-CM

## 2017-10-09 ENCOUNTER — Telehealth: Payer: Self-pay | Admitting: Neurology

## 2017-10-09 DIAGNOSIS — M542 Cervicalgia: Secondary | ICD-10-CM

## 2017-10-09 DIAGNOSIS — M479 Spondylosis, unspecified: Secondary | ICD-10-CM

## 2017-10-09 DIAGNOSIS — R42 Dizziness and giddiness: Secondary | ICD-10-CM

## 2017-10-09 DIAGNOSIS — M530 Cervicocranial syndrome: Secondary | ICD-10-CM

## 2017-10-09 NOTE — Telephone Encounter (Signed)
Called the pt and reviewed the CT results with him. I offered the pt the referrals mentioned for him. Pt is already doing PT for vestibular therapy. Informed him that these would allow them to do techniques that may help the pt with the numbness he is having. Pt aggred to both referrals being placed. I will make Dr Dohmeier aware, once the order is placed I informed him it will go to our referral dept and they will contact him to get him scheduled. Pt verbalized understanding.

## 2017-10-09 NOTE — Telephone Encounter (Signed)
-----   Message from Larey Seat, MD sent at 10/08/2017  5:12 PM EDT ----- There is progression of his DDD at the cervical spine level that can correlate with arm numbness, shoulder pain. It does not fit a facial numbness symptom. The neck lipoma is shrunken in size and not able  to cause the facial numbness. I can offer this patient a referral to sports medicine or physical therapy to help with the neck/ arm numbness. There is no surgical indication at this point.  Larey Seat, MD  Pleas send copy to PCP and referring physician.

## 2017-10-12 ENCOUNTER — Other Ambulatory Visit: Payer: Self-pay | Admitting: Neurology

## 2017-10-12 DIAGNOSIS — M542 Cervicalgia: Secondary | ICD-10-CM

## 2017-10-16 ENCOUNTER — Ambulatory Visit: Payer: BLUE CROSS/BLUE SHIELD | Admitting: Family Medicine

## 2017-10-20 ENCOUNTER — Ambulatory Visit: Payer: BLUE CROSS/BLUE SHIELD | Attending: Neurology

## 2017-10-20 ENCOUNTER — Other Ambulatory Visit: Payer: Self-pay

## 2017-10-20 DIAGNOSIS — M542 Cervicalgia: Secondary | ICD-10-CM | POA: Diagnosis present

## 2017-10-20 DIAGNOSIS — H8113 Benign paroxysmal vertigo, bilateral: Secondary | ICD-10-CM | POA: Diagnosis present

## 2017-10-20 DIAGNOSIS — R42 Dizziness and giddiness: Secondary | ICD-10-CM | POA: Diagnosis not present

## 2017-10-20 DIAGNOSIS — R2689 Other abnormalities of gait and mobility: Secondary | ICD-10-CM | POA: Diagnosis present

## 2017-10-20 NOTE — Therapy (Signed)
[facility] [address] [place], [place], [address] Phone: [phone]   Fax:  [phone]  Physical Therapy Evaluation  Patient Details  Name: Ricky Wade MRN: [id number] Date of Birth: 28-Apr-1966 Referring Provider: Dr. Brett Fairy   Encounter Date: 10/20/2017  PT End of Session - 10/20/17 1055    Visit Number  1    Number of Visits  9    Date for PT Re-Evaluation  11/19/17    Authorization Type  [employer]BS-60 visit limit combined with PT and OT with 1 used.    Authorization - Visit Number  2    Authorization - Number of Visits  60    PT Start Time  0800    PT Stop Time  0841    PT Time Calculation (min)  41 min    Activity Tolerance  Patient tolerated treatment well    Behavior During Therapy  [facility] for tasks assessed/performed       History reviewed. No pertinent past medical history.  Past Surgical History:  Procedure Laterality Date  . COLONOSCOPY  02/10/2012   Procedure: COLONOSCOPY;  Surgeon: Danie Binder, MD;  [facility];  Service: Endoscopy;  Laterality: N/A;  1:10  . lipoma removal      There were no vitals filed for this visit.   Subjective Assessment - 10/20/17 0805    Subjective  Pt reported dizziness is intermittent and it begins when he wakes up and looks up. It began about a month ago. He describes dizziness as a spinning sensation. Pt states dizziness lasts a few minutes and then he feels woozy afterwards. He has nausea with dizziness and rates dizziness at 6-7/10 at worst and 0/10 at best. He denied weakness, N/T, pain or HA with dizziness. Pt hasn't had dizziness in a week. Pt denied falls in the last 6 months. Pt scheduled for 10/30/17 for neck PT    Pertinent History  Cx spine DDD     Patient Stated Goals  Help the dizziness, and know what to do when dizziness occurs    Currently in Pain?  No/denies         [facility] PT Assessment - 10/20/17 7253      Assessment   Medical  Diagnosis  Vertigo, cervalgia    Referring Provider  Dr. Brett Fairy    Onset Date/Surgical Date  09/20/17    Hand Dominance  Right;Left    Prior Therapy  none      Precautions   Precautions  Other (comment) Cx DDD      Restrictions   Weight Bearing Restrictions  No      Balance Screen   Has the patient fallen in the past 6 months  No    Has the patient had a decrease in activity level because of a fear of falling?   No    Is the patient reluctant to leave their home because of a fear of falling?   No      Home Film/video editor residence    Living Arrangements  Spouse/significant other    Available Help at Discharge  Family    Type of [place] to enter    Entrance Stairs-Number of Steps  5    Entrance Stairs-Rails  Right    [place]  One level    [place]  None      Prior Function   Level  of Independence  Independent    Vocation  Full time employment    Vocation Requirements  Driving a truck at work, taking material to different locations, and provokes dizziness.     Leisure  Basketball, fishing, golf, Geographical information systems officer   Overall Cognitive Status  Within Functional Limits for tasks assessed      Observation/Other Assessments   Focus on Therapeutic Outcomes (FOTO)   Risk adjusted score: 67      Ambulation/Gait   Ambulation/Gait  Yes    Ambulation/Gait Assistance  7: Independent    Ambulation Distance (Feet)  100 Feet    Assistive device  None    Gait Pattern  Within Functional Limits guarded head movement    Ambulation Surface  Level;Indoor    Gait velocity  3.32ft/sec.            Vestibular Assessment - 10/20/17 0819      Symptom Behavior   Type of Dizziness  Spinning    Frequency of Dizziness  Pt hasn't had dizziness in about a week, it was approx. 3-4 days/week.     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      Positional Testing   Dix-Hallpike  Dix-Hallpike Right;Dix-Hallpike Left    Horizontal Canal Testing  Horizontal Canal Right;Horizontal Canal Left      Dix-Hallpike Right   Dix-Hallpike Right Duration  <30 sec. with no dizziness reported.     Dix-Hallpike Right Symptoms  Upbeat, right rotatory nystagmus      Dix-Hallpike Left   Dix-Hallpike Left Duration  3/10 dizziness no nystagmus noted., < 20sec.     Dix-Hallpike Left Symptoms  No nystagmus      Horizontal Canal Right   Horizontal Canal Right Duration  none    Horizontal Canal Right Symptoms  Normal      Horizontal Canal Left   Horizontal Canal Left Duration  Slight dizziness reported for < 5 sec.    Horizontal Canal Left Symptoms  Normal          Objective measurements completed on examination: See above findings.       Vestibular Treatment/Exercise - 10/20/17 0844      Vestibular Treatment/Exercise   Vestibular Treatment Provided  Canalith Repositioning    Canalith Repositioning  Epley Manuever Right       EPLEY MANUEVER RIGHT   Number of Reps   1    Overall Response  Improved Symptoms    Response Details   Amplitude of nystagmus noted to decr. after treatment no dizzness reported.             PT Education - 10/20/17 1051    Education provided  Yes    Education Details  PT discussed exam findings, POC, frequency, and duration. PT discussed that we will treat dizziness first, as this is pt's primary complaint, then we will assess cervical spine. PT discussed BPPV.     Person(s) Educated  Patient  Methods  Explanation    Comprehension  Verbalized understanding       PT Short Term Goals - 10/20/17 1214       PT SHORT TERM GOAL #1   Title  same as LTGs        PT Long Term Goals - 10/20/17 1215      PT LONG TERM GOAL #1   Title  Pt will be IND in HEP to improve balance and dizziness and strength. TARGET DATE FOR ALL LTGS: 11/17/17    Status  New      PT LONG TERM GOAL #2   Title  Perform FGA and write goal as indicated.     Status  New      PT LONG TERM GOAL #3   Title  Pt will report 0/10 dizziness during all positional testing and bed mobility to improve QOL and safety during functional mobility.     Status  New      PT LONG TERM GOAL #4   Title  Assess cervical spine and write goals as indicated.     Status  New      PT LONG TERM GOAL #5   Title  Pt will amb. 1000' over even/uneven terrain, while performing head turns, without LOB or dizziness to improve functional mobility. safety.     Status  New             Plan - 10/20/17 1055    Clinical Impression Statement  Pt is a pleasant 52y/o male presenting to OPPT neuro with vertigo and cervicalgia. Pt's PMH significant for the following: Cx spine DDD. Pt experienced dizziness during L positional testing but no nystagmus and no dizziness during R positional testing, but R upbeating nystagmus was present.  Pt has been taking meclizine, which could alter testing results. PT treated for R pBPPV today with R Epley with nystagmus amplitude noted to decr. Pt will continue to assess and treat dizziness as indicated, as it is also possible pt has vestibularitis of some sort based on dizziness in most testing positions but nystagmus only noted on R side. Once resolved, PT will assess Cervical spine and treat as indicated. Pt's gait speed was WNL but noted to amb. in guarded manner 2/2 fear of dizziness. Strength not formally tested but Cx musculature likely weak based on guarded movements. PT will also formally assess balance with FGA as indicated. Pt would benefit from skilled PT to improve safety during functional mobility and to decr. pain.     History and Personal Factors relevant to plan of care:  Pt works full-time and has intermittent dizziness at work. Pt is active (plays sports).     Clinical Presentation  Stable    Clinical Presentation due to:  Cx spine DDD    Clinical Decision Making  Low    Rehab Potential  Good    Clinical Impairments Affecting Rehab Potential  see above    PT Frequency  2x / week    PT Duration  4 weeks    PT Treatment/Interventions  ADLs/Self Care Home Management;Biofeedback;Canalith Repostioning;Electrical Stimulation;Cryotherapy;Gait training;Functional mobility training;Therapeutic activities;Therapeutic exercise;Manual techniques;Vestibular;Patient/family education;Neuromuscular re-education;Balance training    PT Next Visit Plan  Reassess for BPPV in all canals and treat prn. BPPV vs. neuritis? Perform FGA, and assess Cx spine and treat prn. Provide balance HEP prn.     Consulted and Agree with Plan of Care  Patient       Patient will benefit from skilled therapeutic intervention in  order to improve the following deficits and impairments:  Abnormal gait, Dizziness, Decreased range of motion, Impaired flexibility, Postural dysfunction, Decreased balance, Decreased mobility, Decreased knowledge of use of DME, Pain, Decreased strength  Visit Diagnosis: Dizziness and giddiness - Plan: PT plan of care cert/re-cert  BPPV (benign paroxysmal positional vertigo), bilateral - Plan: PT plan of care cert/re-cert  Other abnormalities of gait and mobility - Plan: PT plan of care cert/re-cert  Cervicalgia - Plan: PT plan of care cert/re-cert     Problem List Patient Active Problem List   Diagnosis Date Noted  . Cervicalgia 09/24/2017  . Vestibulitis of ear, unspecified laterality 09/24/2017  . 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 Rectal bleeding 12/29/2011    Ricky Wade L 10/20/2017, 12:20 PM  [facility] [address]  [place], [place], [address] Phone: [phone]   Fax:  [phone]  Name: Ricky Wade MRN: [id number] Date of Birth: August 20, 1965  Geoffry Paradise, PT,DPT 10/20/17 12:20 PM Phone: [phone] Fax: [phone]

## 2017-10-29 ENCOUNTER — Encounter

## 2017-10-30 ENCOUNTER — Ambulatory Visit: Payer: BLUE CROSS/BLUE SHIELD | Admitting: Family Medicine

## 2017-10-30 ENCOUNTER — Ambulatory Visit: Payer: BLUE CROSS/BLUE SHIELD | Attending: Neurology

## 2017-10-30 DIAGNOSIS — H8113 Benign paroxysmal vertigo, bilateral: Secondary | ICD-10-CM | POA: Insufficient documentation

## 2017-10-30 DIAGNOSIS — R42 Dizziness and giddiness: Secondary | ICD-10-CM | POA: Insufficient documentation

## 2017-10-30 DIAGNOSIS — M542 Cervicalgia: Secondary | ICD-10-CM | POA: Diagnosis present

## 2017-10-30 DIAGNOSIS — R2689 Other abnormalities of gait and mobility: Secondary | ICD-10-CM | POA: Diagnosis present

## 2017-10-30 NOTE — Patient Instructions (Signed)
Access Code: [email or web address]  URL: [email or web address]  Date: 10/30/2017  Prepared by: Geoffry Paradise   Exercises  Walking with Head Rotation - 4 reps - 1 sets - 1x daily - 7x weekly  Walking with Head Nod - 4 reps - 1 sets - 1x daily - 7x weekly  Walking with Eyes Closed and Counter Support - 4 reps - 1 sets - 1x daily - 7x weekly  Backwards Walking - 4 reps - 1 sets - 1x daily - 7x weekly  Romberg Stance Eyes Closed on Foam Pad - 3 reps - 1 sets - 1x daily - 7x weekly  Romberg Stance with Head Nods on Foam Pad - 10 reps - 3 sets - 1x daily - 7x weekly

## 2017-10-30 NOTE — Therapy (Signed)
[facility] [address] [place], [place], [address] Phone: [phone]   Fax:  [phone]  Physical Therapy Treatment  Patient Details  Name: Ricky Wade MRN: [id number] Date of Birth: [birth year] Referring Provider: Dr. Brett Fairy   Encounter Date: 10/30/2017  PT End of Session - 10/30/17 1555    Visit Number  2    Number of Visits  9    Date for PT Re-Evaluation  11/19/17    Authorization Type  BCBS-60 visit limit combined with PT and OT with 1 used.    Authorization - Visit Number  3    Authorization - Number of Visits  60    PT Start Time  3267    PT Stop Time  1555    PT Time Calculation (min)  42 min    Equipment Utilized During Treatment  -- min guard to S prn    Activity Tolerance  Patient tolerated treatment well    Behavior During Therapy  [facility] for tasks assessed/performed       History reviewed. No pertinent past medical history.  Past Surgical History:  Procedure Laterality Date  . COLONOSCOPY  02/10/2012   Procedure: COLONOSCOPY;  Surgeon: Danie Binder, MD;  [facility];  Service: Endoscopy;  Laterality: N/A;  1:10  . lipoma removal      There were no vitals filed for this visit.  Subjective Assessment - 10/30/17 1514    Subjective  Pt reported no dizziness since last visit, he feels better. Pt reported the L UE N/T is intermittent, and currently has N/T in L hand.     Pertinent History  Cx spine DDD     Patient Stated Goals  Help the dizziness, and know what to do when dizziness occurs    Currently in Pain?  No/denies         [facility] PT Assessment - 10/30/17 1528      Functional Gait  Assessment   Gait assessed   Yes    Gait Level Surface  Walks 20 ft in less than 7 sec but greater than 5.5 sec, uses assistive device, slower speed, mild gait deviations, or deviates 6-10 in outside of the 12 in walkway width. 6.54 sec.     Change in Gait Speed  Able to smoothly change walking  speed without loss of balance or gait deviation. Deviate no more than 6 in outside of the 12 in walkway width.    Gait with Horizontal Head Turns  Performs head turns smoothly with slight change in gait velocity (eg, minor disruption to smooth gait path), deviates 6-10 in outside 12 in walkway width, or uses an assistive device.    Gait with Vertical Head Turns  Performs task with slight change in gait velocity (eg, minor disruption to smooth gait path), deviates 6 - 10 in outside 12 in walkway width or uses assistive device    Gait and Pivot Turn  Turns slowly, requires verbal cueing, or requires several small steps to catch balance following turn and stop    Step Over Obstacle  Is able to step over 2 stacked shoe boxes taped together (9 in total height) without changing gait speed. No evidence of imbalance.    Gait with Narrow Base of Support  Is able to ambulate for 10 steps heel to toe with no staggering.    Gait with Eyes Closed  Walks 20 ft, slow speed, abnormal gait pattern, evidence for imbalance, deviates 10-15 in  outside 12 in walkway width. Requires more than 9 sec to ambulate 20 ft. 9.6 sec.    Ambulating Backwards  Walks 20 ft, uses assistive device, slower speed, mild gait deviations, deviates 6-10 in outside 12 in walkway width.    Steps  Alternating feet, no rail.    Total Score  22    FGA comment:  22/30: indicates pt is at a medium risk for falls.          Vestibular Assessment - 10/30/17 1515      Positional Testing   Dix-Hallpike  Dix-Hallpike Right;Dix-Hallpike Left    Horizontal Canal Testing  Horizontal Canal Right;Horizontal Canal Left      Dix-Hallpike Right   Dix-Hallpike Right Duration  30 sec. with 2/10 dizziness.    Dix-Hallpike Right Symptoms  Upbeat, right rotatory nystagmus      Dix-Hallpike Left   Dix-Hallpike Left Duration  none    Dix-Hallpike Left Symptoms  No nystagmus      Horizontal Canal Right   Horizontal Canal Right Duration  none     Horizontal Canal Right Symptoms  Normal      Horizontal Canal Left   Horizontal Canal Left Duration  none    Horizontal Canal Left Symptoms  Normal                Vestibular Treatment/Exercise - 10/30/17 1524      Vestibular Treatment/Exercise   Vestibular Treatment Provided  Canalith Repositioning    Canalith Repositioning  Epley Manuever Right       EPLEY MANUEVER RIGHT   Number of Reps   1    Overall Response  Improved Symptoms    Response Details   Improved s/s after treatment. Nystagmus amplitude decr. and dizziness 1/10.          Neuro re-ed: Access Code: [email or web address]  URL: [email or web address]  Date: 10/30/2017  Prepared by: Geoffry Paradise   Exercises  Walking with Head Rotation - 4 reps - 1 sets - 1x daily - 7x weekly  Walking with Head Nod - 4 reps - 1 sets - 1x daily - 7x weekly  Walking with Eyes Closed and Counter Support - 4 reps - 1 sets - 1x daily - 7x weekly  Backwards Walking - 4 reps - 1 sets - 1x daily - 7x weekly  Romberg Stance Eyes Closed on Foam Pad - 3 reps - 1 sets - 1x daily - 7x weekly  Romberg Stance with Head Nods on Foam Pad - 10 reps - 3 sets - 1x daily - 7x weekly  Cues and demo for technique. Incr. Postural sway noted during activities during eyes closed and head turns on pillows.     PT Education - 10/30/17 1555    Education provided  Yes    Education Details  PT discussed BPPV and exam findings. PT educated pt on FGA score and provided pt with balance HEP. PT discussed assessing neck next session to decr. LUE N/T and improve ROM, as pt very guarded.    Person(s) Educated  Patient    Methods  Explanation;Demonstration;Verbal cues;Handout    Comprehension  Returned demonstration;Verbalized understanding       PT Short Term Goals - 10/20/17 1214      PT SHORT TERM GOAL #1   Title  same as LTGs        PT Long Term Goals - 10/30/17 1558      PT LONG TERM GOAL #1   Title  Pt will be  IND in HEP to improve  balance and dizziness and strength. TARGET DATE FOR ALL LTGS: 11/17/17    Status  New      PT LONG TERM GOAL #2   Title  Perform FGA and write goal as indicated.     Status  Achieved      PT LONG TERM GOAL #3   Title  Pt will report 0/10 dizziness during all positional testing and bed mobility to improve QOL and safety during functional mobility.     Status  New      PT LONG TERM GOAL #4   Title  Assess cervical spine and write goals as indicated.     Status  New      PT LONG TERM GOAL #5   Title  Pt will amb. 1000' over even/uneven terrain, while performing head turns, without LOB or dizziness to improve functional mobility. safety.     Status  New      Additional Long Term Goals   Additional Long Term Goals  Yes      PT LONG TERM GOAL #6   Title  Pt will improve FGA score to >/=30/30 to decr. falls risk.     Baseline  22/30    Status  New            Plan - 10/30/17 1556    Clinical Impression Statement  Pt's FGA score indicates pt is at a medium risk for falls. Pt experienced 2/10 dizziness and R upbeating torsional nystagmus during R Dix-Hallpike and reported decr. s/s after R Epleys treatment. PT provided pt with balance HEP to improve vestibular input and balance. Continue with POC.     Rehab Potential  Good    Clinical Impairments Affecting Rehab Potential  see above    PT Frequency  2x / week    PT Duration  4 weeks    PT Treatment/Interventions  ADLs/Self Care Home Management;Biofeedback;Canalith Repostioning;Electrical Stimulation;Cryotherapy;Gait training;Functional mobility training;Therapeutic activities;Therapeutic exercise;Manual techniques;Vestibular;Patient/family education;Neuromuscular re-education;Balance training    PT Next Visit Plan  Reassess for BPPV in all canals and treat prn. BPPV vs. neuritis? Assess Cx spine and treat prn. Review balance HEP prn.     Consulted and Agree with Plan of Care  Patient       Patient will benefit from skilled  therapeutic intervention in order to improve the following deficits and impairments:  Abnormal gait, Dizziness, Decreased range of motion, Impaired flexibility, Postural dysfunction, Decreased balance, Decreased mobility, Decreased knowledge of use of DME, Pain, Decreased strength  Visit Diagnosis: BPPV (benign paroxysmal positional vertigo), bilateral  Dizziness and giddiness  Other abnormalities of gait and mobility     Problem List Patient Active Problem List   Diagnosis Date Noted  . Cervicalgia 09/24/2017  . Vestibulitis of ear, unspecified laterality 09/24/2017  . 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 Rectal bleeding 12/29/2011    Nashya Garlington L 10/30/2017, 3:59 PM  [facility] [address] [place], [place], [address] Phone: [phone]   Fax:  [phone]  Name: Ricky Wade MRN: [id number] Date of Birth: 1966-01-11  Geoffry Paradise, PT,DPT 10/30/17 4:00 PM Phone: [phone] Fax: [phone]

## 2017-11-05 ENCOUNTER — Ambulatory Visit: Payer: BLUE CROSS/BLUE SHIELD

## 2017-11-05 DIAGNOSIS — M542 Cervicalgia: Secondary | ICD-10-CM

## 2017-11-05 DIAGNOSIS — R2689 Other abnormalities of gait and mobility: Secondary | ICD-10-CM

## 2017-11-05 DIAGNOSIS — H8113 Benign paroxysmal vertigo, bilateral: Secondary | ICD-10-CM | POA: Diagnosis not present

## 2017-11-05 DIAGNOSIS — R42 Dizziness and giddiness: Secondary | ICD-10-CM

## 2017-11-05 NOTE — Therapy (Signed)
[facility] [address] [place], [place], [address] Phone: [phone]   Fax:  [phone]  Physical Therapy Treatment  Patient Details  Name: Ricky Wade MRN: [id number] Date of Birth: 03-08-1966 Referring Provider: Dr. Brett Fairy   Encounter Date: 11/05/2017  PT End of Session - 11/05/17 1155    Visit Number  3    Number of Visits  9    Date for PT Re-Evaluation  11/19/17    Authorization Type  BCBS-60 visit limit combined with PT and OT with 1 used.    Authorization - Visit Number  4    Authorization - Number of Visits  60    PT Start Time  0848    PT Stop Time  0932    PT Time Calculation (min)  44 min    Activity Tolerance  Patient tolerated treatment well;No increased pain    Behavior During Therapy  [facility] for tasks assessed/performed       History reviewed. No pertinent past medical history.  Past Surgical History:  Procedure Laterality Date  . COLONOSCOPY  02/10/2012   Procedure: COLONOSCOPY;  Surgeon: Danie Binder, MD;  [facility];  Service: Endoscopy;  Laterality: N/A;  1:10  . lipoma removal      There were no vitals filed for this visit.  Subjective Assessment - 11/05/17 0850    Subjective  Pt denied falls since last visit. Pt reported dizziness is better but he feels heavy-headed when he lies on the R side.     Pertinent History  Cx spine DDD     Patient Stated Goals  Help the dizziness, and know what to do when dizziness occurs    Currently in Pain?  No/denies         [facility] PT Assessment - 11/05/17 0909      ROM / Strength   AROM / PROM / Strength  AROM;Strength      AROM   Overall AROM   Deficits    Overall AROM Comments  No pain except for during R rotation    AROM Assessment Site  Cervical    Cervical Flexion  54    Cervical Extension  40    Cervical - Right Side Bend  35    Cervical - Left Side Bend  41    Cervical - Right Rotation  71 with pt reporting 5/10 pain     Cervical - Left Rotation  68      Strength   Overall Strength  Deficits    Overall Strength Comments  Decr. cervical strength overall, especially retraction and deep neck flexors.          Vestibular Assessment - 11/05/17 0854      Positional Testing   Dix-Hallpike  Dix-Hallpike Right      Dix-Hallpike Right   Dix-Hallpike Right Duration  20 sec. with no dizziness but pt reported "heavy-headed" feeling    Dix-Hallpike Right Symptoms  Upbeat, right rotatory nystagmus               [facility] Adult PT Treatment/Exercise - 11/05/17 1153      Manual Therapy   Manual Therapy  Soft tissue mobilization;Joint mobilization    Manual therapy comments  Pt reported decr. L cervical pain to 1-2/10 after manual therapy, during AROM and PROM R Cx rotation (L neck pain). All manual therapy performed in supine.     Joint Mobilization  PT performed Grade II-III unilateral PAs 3x30  sec. bouts to mid Cx spine. PT provided pt with SNAG HEP, please see pt instructions for HEP details. Pt performed in seated.     Soft tissue mobilization  Upper trap, levator scap to decr. muscle tension.       Vestibular Treatment/Exercise - 11/05/17 0855      Vestibular Treatment/Exercise   Vestibular Treatment Provided  Canalith Repositioning;Habituation    Canalith Repositioning  Epley Manuever Right    Habituation Exercises  Brandt Daroff       EPLEY MANUEVER RIGHT   Number of Reps   1    Overall Response  Improved Symptoms    Response Details   Improved s/s after treatment. Nystagmus amplitude and no dizziness, with pt reported heavy-head sensation reduced to 3/10.       Nestor Lewandowsky   Number of Reps   3     Please see HEP for details re: Nestor Lewandowsky, cues and demo for technique. Pt reported no s/s after treatment.        PT Education - 11/05/17 1155    Education provided  Yes    Education Details  Discussed BPPV and habituation HEP. Provided pt with Cx HEP and discussed Cx exam findings.         PT Short Term Goals - 10/20/17 1214      PT SHORT TERM GOAL #1   Title  same as LTGs        PT Long Term Goals - 11/05/17 1202      PT LONG TERM GOAL #1   Title  Pt will be IND in HEP to improve balance and dizziness and strength. TARGET DATE FOR ALL LTGS: 11/17/17    Status  New      PT LONG TERM GOAL #2   Title  Perform FGA and write goal as indicated.     Status  Achieved      PT LONG TERM GOAL #3   Title  Pt will report 0/10 dizziness during all positional testing and bed mobility to improve QOL and safety during functional mobility.     Status  New      PT LONG TERM GOAL #4   Title  Assess cervical spine and write goals as indicated.     Status  Achieved      PT LONG TERM GOAL #5   Title  Pt will amb. 1000' over even/uneven terrain, while performing head turns, without LOB or dizziness to improve functional mobility. safety.     Status  New      PT LONG TERM GOAL #6   Title  Pt will improve FGA score to >/=30/30 to decr. falls risk.     Baseline  22/30    Status  New      PT LONG TERM GOAL #7   Title  Pt will perform R cervical rotation WNL and no pain to improve QOL.     Status  New            Plan - 11/05/17 1156    Clinical Impression Statement  Pt continues to experience R upbeating torsional nystagmus during R Dix-Hallpike and heaviness in head, with s/s improving with R Epleys. PT also provided pt with habituation to decr. s/s, as pt reported no dizziness, wooziness, or heaviness after habituation. PT assessed Cx spine and found limited ROM, L-sided neck pain during R sided Cx rotation, which all improved with manual therapy an L unilat. PAs. continue with POC.  Rehab Potential  Good    Clinical Impairments Affecting Rehab Potential  see above    PT Frequency  2x / week    PT Duration  4 weeks    PT Treatment/Interventions  ADLs/Self Care Home Management;Biofeedback;Canalith Repostioning;Electrical Stimulation;Cryotherapy;Gait  training;Functional mobility training;Therapeutic activities;Therapeutic exercise;Manual techniques;Vestibular;Patient/family education;Neuromuscular re-education;Balance training    PT Next Visit Plan  Manual therapy for Cx spine and treat prn. Treat R BPPV as needed. Review balance HEP prn.     Consulted and Agree with Plan of Care  Patient       Patient will benefit from skilled therapeutic intervention in order to improve the following deficits and impairments:  Abnormal gait, Dizziness, Decreased range of motion, Impaired flexibility, Postural dysfunction, Decreased balance, Decreased mobility, Decreased knowledge of use of DME, Pain, Decreased strength  Visit Diagnosis: BPPV (benign paroxysmal positional vertigo), bilateral  Dizziness and giddiness  Other abnormalities of gait and mobility  Cervicalgia     Problem List Patient Active Problem List   Diagnosis Date Noted  . Cervicalgia 09/24/2017  . Vestibulitis of ear, unspecified laterality 09/24/2017  . 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 Rectal bleeding 12/29/2011    Cruzito Standre L 11/05/2017, 12:03 PM  [facility] [address] [place], [place], [address] Phone: [phone]   Fax:  [phone]  Name: EMMA SCHUPP MRN: [id number] Date of Birth: [birth year]  Geoffry Paradise, PT,DPT 11/05/17 12:03 PM Phone: [phone] Fax: [phone]

## 2017-11-05 NOTE — Patient Instructions (Addendum)
Sit to Side-Lying    Sit on edge of bed. 1. Turn head 45 to right. 2. Maintain head position and lie down slowly on left side. Hold until symptoms subside plus 30 seconds.. 3. Sit up slowly. Hold until symptoms subside. 4. Turn head 45 to left. 5. Maintain head position and lie down slowly on right side. Hold until symptoms subside plus 30 seconds.. 6. Sit up slowly. Repeat sequence __3-5__ times per session. Do __1__ sessions per day.  Copyright  VHI. All rights reserved.   Access Code: [email or web address]  URL: [email or web address]  Date: 11/05/2017  Prepared by: Geoffry Paradise   Exercises   Seated Assisted Cervical Rotation with Towel - 10 reps - 2-5 hold - 1x daily - 7x weekly

## 2017-11-06 ENCOUNTER — Ambulatory Visit: Payer: BLUE CROSS/BLUE SHIELD

## 2017-11-09 ENCOUNTER — Ambulatory Visit: Payer: BLUE CROSS/BLUE SHIELD

## 2017-11-09 DIAGNOSIS — H8113 Benign paroxysmal vertigo, bilateral: Secondary | ICD-10-CM | POA: Diagnosis not present

## 2017-11-09 DIAGNOSIS — R2689 Other abnormalities of gait and mobility: Secondary | ICD-10-CM

## 2017-11-09 DIAGNOSIS — R42 Dizziness and giddiness: Secondary | ICD-10-CM

## 2017-11-09 DIAGNOSIS — M542 Cervicalgia: Secondary | ICD-10-CM

## 2017-11-09 NOTE — Therapy (Signed)
[facility] [address] [place], [place], [address] Phone: [phone]   Fax:  [phone]  Physical Therapy Treatment  Patient Details  Name: Ricky Wade MRN: [id number] Date of Birth: 17-Sep-1965 Referring Provider: Dr. Brett Fairy   Encounter Date: 11/09/2017  PT End of Session - 11/09/17 1622    Visit Number  4    Number of Visits  9    Date for PT Re-Evaluation  11/19/17    Authorization Type  BCBS-60 visit limit combined with PT and OT with 1 used.    Authorization - Visit Number  5    Authorization - Number of Visits  60    PT Start Time  7893    PT Stop Time  1611    PT Time Calculation (min)  40 min    Equipment Utilized During Treatment  Gait belt    Activity Tolerance  Patient tolerated treatment well;No increased pain    Behavior During Therapy  [facility] for tasks assessed/performed       History reviewed. No pertinent past medical history.  Past Surgical History:  Procedure Laterality Date  . COLONOSCOPY  02/10/2012   Procedure: COLONOSCOPY;  Surgeon: Danie Binder, MD;  [facility];  Service: Endoscopy;  Laterality: N/A;  1:10  . lipoma removal      There were no vitals filed for this visit.  Subjective Assessment - 11/09/17 1533    Subjective  Pt denied falls since last visit. Pt reported dizziness is better but he still experiences dizziness when bending forward at times. Pt's L UE has N/T when he lies on L side at night, while sleeping. His neck felt better after last session.     Pertinent History  Cx spine DDD     Patient Stated Goals  Help the dizziness, and know what to do when dizziness occurs    Currently in Pain?  No/denies                       [facility] Adult PT Treatment/Exercise - 11/09/17 1619      Manual Therapy   Manual Therapy  Soft tissue mobilization;Joint mobilization;Other (comment) Cx distraction 3x30 sec. holds    Manual therapy comments  Pt  reported decr. L cervical pain to 1/10 after manual therapy, during AROM and PROM R Cx rotation (L neck pain). All manual therapy performed in supine.     Joint Mobilization  PT performed Grade II-III unilateral PAs 3x30 sec. bouts to mid Cx spine.     Soft tissue mobilization  Upper trap, levator scap to decr. muscle tension. UT trap contract and relax technique 2x10 sec. hold with OP to L shoulder during UT stretch afterwards, 2x30sec. hold.          Balance Exercises - 11/09/17 1621      Balance Exercises: Standing   Rockerboard  Anterior/posterior;Lateral;Head turns;EO;EC;10 seconds;30 seconds progressed to no UE support.     Other Standing Exercises  Performed in //bars with min guard to min A for safety. Cues and demo for technique.           PT Short Term Goals - 10/20/17 1214      PT SHORT TERM GOAL #1   Title  same as LTGs        PT Long Term Goals - 11/05/17 1202      PT LONG TERM GOAL #1   Title  Pt will be IND in  HEP to improve balance and dizziness and strength. TARGET DATE FOR ALL LTGS: 11/17/17    Status  New      PT LONG TERM GOAL #2   Title  Perform FGA and write goal as indicated.     Status  Achieved      PT LONG TERM GOAL #3   Title  Pt will report 0/10 dizziness during all positional testing and bed mobility to improve QOL and safety during functional mobility.     Status  New      PT LONG TERM GOAL #4   Title  Assess cervical spine and write goals as indicated.     Status  Achieved      PT LONG TERM GOAL #5   Title  Pt will amb. 1000' over even/uneven terrain, while performing head turns, without LOB or dizziness to improve functional mobility. safety.     Status  New      PT LONG TERM GOAL #6   Title  Pt will improve FGA score to >/=30/30 to decr. falls risk.     Baseline  22/30    Status  New      PT LONG TERM GOAL #7   Title  Pt will perform R cervical rotation WNL and no pain to improve QOL.     Status  New            Plan -  11/09/17 1623    Clinical Impression Statement  Pt continues to demonstrate progress, as he reported dizziness is 70% better and HEP is going well. Pt continues to experience incr. postural sway during activities which require incr. vestibular input. Pt reported decr. neck pain and L UE N/T after manual therapy. Continue with POC.     Rehab Potential  Good    Clinical Impairments Affecting Rehab Potential  see above    PT Frequency  2x / week    PT Duration  4 weeks    PT Treatment/Interventions  ADLs/Self Care Home Management;Biofeedback;Canalith Repostioning;Electrical Stimulation;Cryotherapy;Gait training;Functional mobility training;Therapeutic activities;Therapeutic exercise;Manual techniques;Vestibular;Patient/family education;Neuromuscular re-education;Balance training    PT Next Visit Plan  Manual therapy for Cx spine and treat prn. Treat R BPPV as needed. PT will check goals next week, and determine if pt should continue vestibular rehab and might refer to [address] for dry needling assessment.     Consulted and Agree with Plan of Care  Patient       Patient will benefit from skilled therapeutic intervention in order to improve the following deficits and impairments:  Abnormal gait, Dizziness, Decreased range of motion, Impaired flexibility, Postural dysfunction, Decreased balance, Decreased mobility, Decreased knowledge of use of DME, Pain, Decreased strength  Visit Diagnosis: Cervicalgia  Dizziness and giddiness  Other abnormalities of gait and mobility     Problem List Patient Active Problem List   Diagnosis Date Noted  . Cervicalgia 09/24/2017  . Vestibulitis of ear, unspecified laterality 09/24/2017  . 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 Rectal bleeding 12/29/2011    Leston Schueller L 11/09/2017, 4:26 PM  [facility] [address] [place], [place], [address] Phone: [phone]   Fax:   [phone]  Name: Ricky Wade MRN: [id number] Date of Birth: 11-Sep-1965  Geoffry Paradise, PT,DPT 11/09/17 4:26 PM Phone: [phone] Fax: [phone]

## 2017-11-11 ENCOUNTER — Ambulatory Visit: Payer: BLUE CROSS/BLUE SHIELD

## 2017-11-11 DIAGNOSIS — R42 Dizziness and giddiness: Secondary | ICD-10-CM

## 2017-11-11 DIAGNOSIS — M542 Cervicalgia: Secondary | ICD-10-CM

## 2017-11-11 DIAGNOSIS — R2689 Other abnormalities of gait and mobility: Secondary | ICD-10-CM

## 2017-11-11 DIAGNOSIS — H8113 Benign paroxysmal vertigo, bilateral: Secondary | ICD-10-CM | POA: Diagnosis not present

## 2017-11-11 NOTE — Therapy (Signed)
[facility] [address] [place], [place], [address] Phone: [phone]   Fax:  [phone]  Physical Therapy Treatment  Patient Details  Name: Ricky Wade MRN: [id number] Date of Birth: 10-28-1965 Referring Provider: Dr. Brett Fairy   Encounter Date: 11/11/2017  PT End of Session - 11/11/17 0928    Visit Number  5    Number of Visits  9    Date for PT Re-Evaluation  11/19/17    Authorization Type  BCBS-60 visit limit combined with PT and OT with 1 used.    Authorization - Visit Number  6    Authorization - Number of Visits  60    PT Start Time  0093    PT Stop Time  0925    PT Time Calculation (min)  38 min    Equipment Utilized During Treatment  Gait belt    Activity Tolerance  Patient tolerated treatment well;No increased pain    Behavior During Therapy  [facility] for tasks assessed/performed       History reviewed. No pertinent past medical history.  Past Surgical History:  Procedure Laterality Date  . COLONOSCOPY  02/10/2012   Procedure: COLONOSCOPY;  Surgeon: Danie Binder, MD;  [facility];  Service: Endoscopy;  Laterality: N/A;  1:10  . lipoma removal      There were no vitals filed for this visit.  Subjective Assessment - 11/11/17 0849    Subjective  Pt felt great after last session, and reported that he slept on his R side and supine and it helped decr. L UE N/T and L neck pain. However, it incr. at work again as the ride he uses at work is not working correctly and he has to use extreme wrist ext/flex and neck rotation, which incr. L hand N/T. Pt denied dizziness since last visit. PT noted that pt's L hand (palm) is a little red and swollen, pt reports he grips with L hand harder on machine as that's his dominant hand.     Pertinent History  Cx spine DDD     Patient Stated Goals  Help the dizziness, and know what to do when dizziness occurs    Currently in Pain?  No/denies no pain but L hand  N/T                       [facility] Adult PT Treatment/Exercise - 11/11/17 0854      Manual Therapy   Manual Therapy  Soft tissue mobilization;Joint mobilization;Other (comment)    Manual therapy comments  Pt reported decr. L cervical pain to 0/10 after manual therapy, during AROM and PROM R Cx rotation (L neck pain). All manual therapy performed in supine.     Joint Mobilization  PT performed Grade II-III unilateral PAs 3x30 sec. bouts to mid Cx spine.     Soft tissue mobilization  Upper trap, levator scap to decr. muscle tension. UT trap contract and relax technique 2x10 sec. hold with OP to L shoulder during UT stretch afterwards, 2x30sec. hold.    Other Manual Therapy  Contract and relax UT with pt reported decr. L hand N/T, x10 sec.          Balance Exercises - 11/11/17 0926      Balance Exercises: Standing   Other Standing Exercises  Performed in // bars with min A to min guard with cues and demo for safety: pt stood on bosu balls with 0-1 UE support  and performed: ant/lat/post weight shifting, head turns/nods x10/direction, eyes open x30 sec., eyes closed 3x10 sec., x10 reps squats.        PT Education - 11/11/17 [phone]    Education provided  Yes    Education Details  PT suggested pt speak with boss about fixing pt's former machine to decr. L UE N/T and neck pain.     Person(s) Educated  Patient    Methods  Explanation    Comprehension  Verbalized understanding       PT Short Term Goals - 10/20/17 1214      PT SHORT TERM GOAL #1   Title  same as LTGs        PT Long Term Goals - 11/05/17 1202      PT LONG TERM GOAL #1   Title  Pt will be IND in HEP to improve balance and dizziness and strength. TARGET DATE FOR ALL LTGS: 11/17/17    Status  New      PT LONG TERM GOAL #2   Title  Perform FGA and write goal as indicated.     Status  Achieved      PT LONG TERM GOAL #3   Title  Pt will report 0/10 dizziness during all positional testing and bed mobility  to improve QOL and safety during functional mobility.     Status  New      PT LONG TERM GOAL #4   Title  Assess cervical spine and write goals as indicated.     Status  Achieved      PT LONG TERM GOAL #5   Title  Pt will amb. 1000' over even/uneven terrain, while performing head turns, without LOB or dizziness to improve functional mobility. safety.     Status  New      PT LONG TERM GOAL #6   Title  Pt will improve FGA score to >/=30/30 to decr. falls risk.     Baseline  22/30    Status  New      PT LONG TERM GOAL #7   Title  Pt will perform R cervical rotation WNL and no pain to improve QOL.     Status  New            Plan - 11/11/17 7494    Clinical Impression Statement  Pt continues to demonstrate progress, as pt denied dizziness today. Pt also reported 0/10 neck pain after manual therapy. High level balance activities continue to incr. postural sway and LOB. Pt would continue to benefit from skilled PT to improve safety during functional mobility.     Rehab Potential  Good    Clinical Impairments Affecting Rehab Potential  see above    PT Frequency  2x / week    PT Duration  4 weeks    PT Treatment/Interventions  ADLs/Self Care Home Management;Biofeedback;Canalith Repostioning;Electrical Stimulation;Cryotherapy;Gait training;Functional mobility training;Therapeutic activities;Therapeutic exercise;Manual techniques;Vestibular;Patient/family education;Neuromuscular re-education;Balance training    PT Next Visit Plan  Check goals. Manual therapy for Cx spine and treat prn. Treat R BPPV as needed. PT will check goals next week, and determine if pt should continue vestibular rehab and might refer to [address] for dry needling assessment.     Consulted and Agree with Plan of Care  Patient       Patient will benefit from skilled therapeutic intervention in order to improve the following deficits and impairments:  Abnormal gait, Dizziness, Decreased range of motion, Impaired  flexibility, Postural dysfunction, Decreased balance, Decreased  mobility, Decreased knowledge of use of DME, Pain, Decreased strength  Visit Diagnosis: Cervicalgia  Dizziness and giddiness  Other abnormalities of gait and mobility     Problem List Patient Active Problem List   Diagnosis Date Noted  . Cervicalgia 09/24/2017  . Vestibulitis of ear, unspecified laterality 09/24/2017  . 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 Rectal bleeding 12/29/2011    Siddh Vandeventer L 11/11/2017, 9:29 AM  [facility] [address] [place], [place], [address] Phone: [phone]   Fax:  [phone]  Name: Ricky Wade MRN: [id number] Date of Birth: 1965/10/12  Geoffry Paradise, PT,DPT 11/11/17 9:30 AM Phone: [phone] Fax: [phone]

## 2017-11-17 ENCOUNTER — Ambulatory Visit: Payer: BLUE CROSS/BLUE SHIELD

## 2017-11-19 ENCOUNTER — Ambulatory Visit: Payer: BLUE CROSS/BLUE SHIELD

## 2017-11-19 DIAGNOSIS — R2689 Other abnormalities of gait and mobility: Secondary | ICD-10-CM

## 2017-11-19 DIAGNOSIS — M542 Cervicalgia: Secondary | ICD-10-CM

## 2017-11-19 DIAGNOSIS — H8113 Benign paroxysmal vertigo, bilateral: Secondary | ICD-10-CM

## 2017-11-19 DIAGNOSIS — R42 Dizziness and giddiness: Secondary | ICD-10-CM

## 2017-11-19 NOTE — Therapy (Signed)
[facility] [address] [place], [place], [address] Phone: [phone]   Fax:  [phone]  Physical Therapy Treatment  Patient Details  Name: Ricky Wade MRN: [id number] Date of Birth: May 03, 1966 Referring Provider: Dr. Brett Fairy   Encounter Date: 11/19/2017  PT End of Session - 11/19/17 1601    Visit Number  6    Number of Visits  9    Date for PT Re-Evaluation  11/19/17    Authorization Type  BCBS-60 visit limit combined with PT and OT with 1 used.    Authorization - Visit Number  7    Authorization - Number of Visits  60    PT Start Time  7628    PT Stop Time  1529    PT Time Calculation (min)  38 min    Equipment Utilized During Treatment  -- min guard to S prn    Activity Tolerance  Patient tolerated treatment well    Behavior During Therapy  [facility] for tasks assessed/performed       History reviewed. No pertinent past medical history.  Past Surgical History:  Procedure Laterality Date  . COLONOSCOPY  02/10/2012   Procedure: COLONOSCOPY;  Surgeon: Danie Binder, MD;  [facility];  Service: Endoscopy;  Laterality: N/A;  1:10  . lipoma removal      There were no vitals filed for this visit.  Subjective Assessment - 11/19/17 1453    Subjective  Pt reported dizziness has been much better but he reported he woke up with "heavy-headedness" last night but no spinning. Pt reported L hand N/T has been better, as pt has been using his old machine at work - its still not quick fixed but functional. Pt reported neck pain has been better.     Pertinent History  Cx spine DDD     Patient Stated Goals  Help the dizziness, and know what to do when dizziness occurs    Currently in Pain?  No/denies         [facility] PT Assessment - 11/19/17 1506      AROM   Cervical Flexion  65 slight discomfort in L side of post. neck    Cervical Extension  57    Cervical - Right Side Bend  42 slight L sided neck pain     Cervical - Left Side Bend  44    Cervical - Right Rotation  76    Cervical - Left Rotation  79      Functional Gait  Assessment   Gait assessed   Yes    Gait Level Surface  Walks 20 ft in less than 5.5 sec, no assistive devices, good speed, no evidence for imbalance, normal gait pattern, deviates no more than 6 in outside of the 12 in walkway width. 5.3 sec.    Change in Gait Speed  Able to smoothly change walking speed without loss of balance or gait deviation. Deviate no more than 6 in outside of the 12 in walkway width.    Gait with Horizontal Head Turns  Performs head turns smoothly with no change in gait. Deviates no more than 6 in outside 12 in walkway width    Gait with Vertical Head Turns  Performs head turns with no change in gait. Deviates no more than 6 in outside 12 in walkway width.    Gait and Pivot Turn  Pivot turns safely within 3 sec and stops quickly with no loss of balance.  Step Over Obstacle  Is able to step over 2 stacked shoe boxes taped together (9 in total height) without changing gait speed. No evidence of imbalance.    Gait with Narrow Base of Support  Is able to ambulate for 10 steps heel to toe with no staggering.    Gait with Eyes Closed  Walks 20 ft, no assistive devices, good speed, no evidence of imbalance, normal gait pattern, deviates no more than 6 in outside 12 in walkway width. Ambulates 20 ft in less than 7 sec.    Ambulating Backwards  Walks 20 ft, no assistive devices, good speed, no evidence for imbalance, normal gait    Steps  Alternating feet, no rail.    Total Score  30    FGA comment:  30/30: indicating pt is at normal limits.         Vestibular Assessment - 11/19/17 1513      Positional Testing   Dix-Hallpike  Dix-Hallpike Right    Horizontal Canal Testing  Horizontal Canal Right;Horizontal Canal Left      Dix-Hallpike Right   Dix-Hallpike Right Duration  0    Dix-Hallpike Right Symptoms  No nystagmus      Dix-Hallpike Left    Dix-Hallpike Left Duration  0    Dix-Hallpike Left Symptoms  No nystagmus      Horizontal Canal Right   Horizontal Canal Right Duration  0    Horizontal Canal Right Symptoms  Normal      Horizontal Canal Left   Horizontal Canal Left Duration  0    Horizontal Canal Left Symptoms  Normal               [facility] Adult PT Treatment/Exercise - 11/19/17 1506      Ambulation/Gait   Ambulation/Gait  Yes    Ambulation/Gait Assistance  7: Independent    Ambulation Distance (Feet)  1000 Feet    Assistive device  None    Gait Pattern  Within Functional Limits    Ambulation Surface  Level;Unlevel;Indoor;Outdoor;Paved;Grass    Gait Comments  Pt amb. over different terrain while performing head turns without LOB.       Manual Therapy   Manual Therapy  Joint mobilization;Other (comment) Cx distraction     Manual therapy comments  Pt reported 0/10 pain after manual therapy, duing all Cx P/AROM. All manual therapy performed with pt in supine.     Joint Mobilization  PT performed Grade II unilateral PAs 3x30 sec. bouts to mid Cx spine.     Other Manual Therapy  Cx distraction to improve relaxtion of cx spine musculature 2x30-45 sec. bouts.              PT Education - 11/19/17 1600    Education provided  Yes    Education Details  PT discussed goal progress with pt and hold until next week, will assess LTG 1 (HEP) and likely discharge based on progress. PT also discussed outcome measures with pt.     Person(s) Educated  Patient    Methods  Explanation    Comprehension  Verbalized understanding       PT Short Term Goals - 10/20/17 1214      PT SHORT TERM GOAL #1   Title  same as LTGs        PT Long Term Goals - 11/19/17 1603      PT LONG TERM GOAL #1   Title  Pt will be IND in HEP to improve balance and dizziness  and strength. TARGET DATE FOR ALL LTGS: 11/17/17    Status  New      PT LONG TERM GOAL #2   Title  Perform FGA and write goal as indicated.     Status  Achieved       PT LONG TERM GOAL #3   Title  Pt will report 0/10 dizziness during all positional testing and bed mobility to improve QOL and safety during functional mobility.     Status  Achieved      PT LONG TERM GOAL #4   Title  Assess cervical spine and write goals as indicated.     Status  Achieved      PT LONG TERM GOAL #5   Title  Pt will amb. 1000' over even/uneven terrain, while performing head turns, without LOB or dizziness to improve functional mobility. safety.     Status  Achieved      PT LONG TERM GOAL #6   Title  Pt will improve FGA score to >/=30/30 to decr. falls risk.     Baseline  22/30    Status  Achieved      PT LONG TERM GOAL #7   Title  Pt will perform R cervical rotation WNL and no pain to improve QOL.     Status  Partially Met            Plan - 11/19/17 1601    Clinical Impression Statement  Pt demonstrated progress, as he met LTGs 3, 5, and 6. Pt partially met LTG 7 as Cx AROM is WNL but pt did report mild (3/10) during R sidebending and flexion. Positional testing was negative today for s/s of BPPV. PT will assess LTG 1 (HEP) next session and likely d/c based on pt's progress.     Rehab Potential  Good    Clinical Impairments Affecting Rehab Potential  see above    PT Frequency  2x / week    PT Duration  4 weeks    PT Treatment/Interventions  ADLs/Self Care Home Management;Biofeedback;Canalith Repostioning;Electrical Stimulation;Cryotherapy;Gait training;Functional mobility training;Therapeutic activities;Therapeutic exercise;Manual techniques;Vestibular;Patient/family education;Neuromuscular re-education;Balance training    PT Next Visit Plan  Check LTG 1 (HEP) and likely d/c. Refer to [address] for dry needling assessment, if neck pain returns.     Consulted and Agree with Plan of Care  Patient       Patient will benefit from skilled therapeutic intervention in order to improve the following deficits and impairments:  Abnormal gait, Dizziness,  Decreased range of motion, Impaired flexibility, Postural dysfunction, Decreased balance, Decreased mobility, Decreased knowledge of use of DME, Pain, Decreased strength  Visit Diagnosis: Cervicalgia  Dizziness and giddiness  Other abnormalities of gait and mobility  BPPV (benign paroxysmal positional vertigo), bilateral     Problem List Patient Active Problem List   Diagnosis Date Noted  . Cervicalgia 09/24/2017  . Vestibulitis of ear, unspecified laterality 09/24/2017  . 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 Rectal bleeding 12/29/2011    Abdulkareem Badolato L 11/19/2017, 4:07 PM  [facility] [address] [place], [place], [address] Phone: [phone]   Fax:  [phone]  Name: Ricky Wade MRN: [id number] Date of Birth: 1965/12/17  Geoffry Paradise, PT,DPT 11/19/17 4:07 PM Phone: [phone] Fax: [phone]

## 2017-11-20 ENCOUNTER — Ambulatory Visit: Payer: BLUE CROSS/BLUE SHIELD

## 2017-11-24 ENCOUNTER — Ambulatory Visit: Payer: BLUE CROSS/BLUE SHIELD

## 2017-11-25 ENCOUNTER — Ambulatory Visit: Payer: BLUE CROSS/BLUE SHIELD | Attending: Neurology

## 2017-11-25 DIAGNOSIS — R42 Dizziness and giddiness: Secondary | ICD-10-CM | POA: Diagnosis present

## 2017-11-25 DIAGNOSIS — M542 Cervicalgia: Secondary | ICD-10-CM | POA: Diagnosis present

## 2017-11-25 DIAGNOSIS — R2689 Other abnormalities of gait and mobility: Secondary | ICD-10-CM | POA: Diagnosis present

## 2017-11-25 DIAGNOSIS — H8113 Benign paroxysmal vertigo, bilateral: Secondary | ICD-10-CM

## 2017-11-25 NOTE — Patient Instructions (Signed)
Access Code: [email or web address]  URL: [email or web address]  Date: 11/25/2017  Prepared by: Geoffry Paradise   Exercises  Walking with Head Rotation - 4 reps - 1 sets - 1x daily - 7x weekly  Walking with Head Nod - 4 reps - 1 sets - 1x daily - 7x weekly  Walking with Eyes Closed and Counter Support - 4 reps - 1 sets - 1x daily - 7x weekly  Backwards Walking - 4 reps - 1 sets - 1x daily - 7x weekly  Romberg Stance Eyes Closed on Foam Pad - 3 reps - 1 sets - 1x daily - 7x weekly  Romberg Stance with Head Nods on Foam Pad - 10 reps - 3 sets - 1x daily - 7x weekly  Seated Assisted Cervical Rotation with Towel - 10 reps - 2-5 hold - 1x daily - 7x weekly  Brandt-Daroff Vestibular Exercise - 3-5 reps - 1-2 sets - 1x daily - 7x weekly

## 2017-11-25 NOTE — Therapy (Signed)
[facility] [address] [place], [place], [address] Phone: [phone]   Fax:  [phone]  Physical Therapy Treatment  Patient Details  Name: Ricky Wade MRN: [id number] Date of Birth: 16-Aug-1965 Referring Provider: Dr. Brett Fairy   Encounter Date: 11/25/2017  PT End of Session - 11/25/17 0909    Visit Number  7    Number of Visits  9    Date for PT Re-Evaluation  11/19/17    Authorization Type  BCBS-60 visit limit combined with PT and OT with 1 used.    Authorization - Visit Number  8    Authorization - Number of Visits  60    PT Start Time  0141    PT Stop Time  0907    PT Time Calculation (min)  18 min    Activity Tolerance  Patient tolerated treatment well;No increased pain    Behavior During Therapy  [facility] for tasks assessed/performed       History reviewed. No pertinent past medical history.  Past Surgical History:  Procedure Laterality Date  . COLONOSCOPY  02/10/2012   Procedure: COLONOSCOPY;  Surgeon: Danie Binder, MD;  [facility];  Service: Endoscopy;  Laterality: N/A;  1:10  . lipoma removal      There were no vitals filed for this visit.  Subjective Assessment - 11/25/17 0851    Subjective  Pt denied changes since last visit. Pt reported neck pain is still better but occasionally aches.     Pertinent History  Cx spine DDD     Patient Stated Goals  Help the dizziness, and know what to do when dizziness occurs    Currently in Pain?  No/denies             Neuro re-ed: Access Code: [email or web address]  URL: [email or web address]  Date: 11/25/2017  Prepared by: Geoffry Paradise   Exercises  Walking with Head Rotation - 4 reps - 1 sets - 1x daily - 7x weekly  Walking with Head Nod - 4 reps - 1 sets - 1x daily - 7x weekly  Walking with Eyes Closed and Counter Support - 4 reps - 1 sets - 1x daily - 7x weekly  Backwards Walking - 4 reps - 1 sets - 1x daily - 7x weekly   Romberg Stance Eyes Closed on Foam Pad - 3 reps - 1 sets - 1x daily - 7x weekly  Romberg Stance with Head Nods on Foam Pad - 10 reps - 3 sets - 1x daily - 7x weekly  Seated Assisted Cervical Rotation with Towel - 10 reps - 2-5 hold - 1x daily - 7x weekly  Brandt-Daroff Vestibular Exercise - 3-5 reps - 1-2 sets - 1x daily - 7x weekly    Pt performed 2 reps of each exercise. Reduced frequency of HEP to 1-2x/week for maintenance. No pain during session.  Balance performed at counter or in corner with S to ensure safety.                  PT Education - 11/25/17 0908    Education provided  Yes    Education Details  PT reviewed and modified HEP, due to pt's excellent progress. PT discussed goal progress and d/c. Pt agreeable. PT discussed requiring new order if dizziness returns or eval at orthopedic PT (with dry needling) if neck pain returns.     Person(s) Educated  Patient    Methods  Explanation;Verbal cues;Handout    Comprehension  Returned demonstration;Verbalized understanding       PT Short Term Goals - 10/20/17 1214      PT SHORT TERM GOAL #1   Title  same as LTGs        PT Long Term Goals - 11/25/17 0911      PT LONG TERM GOAL #1   Title  Pt will be IND in HEP to improve balance and dizziness and strength. TARGET DATE FOR ALL LTGS: 11/17/17    Status  Achieved      PT LONG TERM GOAL #2   Title  Perform FGA and write goal as indicated.     Status  Achieved      PT LONG TERM GOAL #3   Title  Pt will report 0/10 dizziness during all positional testing and bed mobility to improve QOL and safety during functional mobility.     Status  Achieved      PT LONG TERM GOAL #4   Title  Assess cervical spine and write goals as indicated.     Status  Achieved      PT LONG TERM GOAL #5   Title  Pt will amb. 1000' over even/uneven terrain, while performing head turns, without LOB or dizziness to improve functional mobility. safety.     Status  Achieved      PT LONG  TERM GOAL #6   Title  Pt will improve FGA score to >/=30/30 to decr. falls risk.     Baseline  22/30    Status  Achieved      PT LONG TERM GOAL #7   Title  Pt will perform R cervical rotation WNL and no pain to improve QOL.     Status  Partially Met            Plan - 11/25/17 0909    Clinical Impression Statement  Pt met LTG 1 and all LTGs, except for partially meeting LTG 7. Please see d/c summary for details.     Rehab Potential  Good    Clinical Impairments Affecting Rehab Potential  see above    PT Frequency  -- one last visit, 11/25/17    PT Duration  --    PT Treatment/Interventions  ADLs/Self Care Home Management;Biofeedback;Canalith Repostioning;Electrical Stimulation;Cryotherapy;Gait training;Functional mobility training;Therapeutic activities;Therapeutic exercise;Manual techniques;Vestibular;Patient/family education;Neuromuscular re-education;Balance training    PT Next Visit Plan  d/c    Consulted and Agree with Plan of Care  Patient       Patient will benefit from skilled therapeutic intervention in order to improve the following deficits and impairments:  Abnormal gait, Dizziness, Decreased range of motion, Impaired flexibility, Postural dysfunction, Decreased balance, Decreased mobility, Decreased knowledge of use of DME, Pain, Decreased strength  Visit Diagnosis: Dizziness and giddiness  Cervicalgia  Other abnormalities of gait and mobility  BPPV (benign paroxysmal positional vertigo), bilateral     Problem List Patient Active Problem List   Diagnosis Date Noted  . Cervicalgia 09/24/2017  . Vestibulitis of ear, unspecified laterality 09/24/2017  . 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 Rectal bleeding 12/29/2011    Traxton Kolenda L 11/25/2017, 9:15 AM  [facility] [address] [place], [place], [address] Phone: [phone]   Fax:  [phone]  Name: Ricky Wade MRN:  [id number] Date of Birth: April 07, 1966  PHYSICAL THERAPY DISCHARGE SUMMARY  Visits from Start of Care: 7  Current functional level related to goals / functional outcomes: PT Long Term Goals - 11/25/17 3500  PT LONG TERM GOAL #1   Title  Pt will be IND in HEP to improve balance and dizziness and strength. TARGET DATE FOR ALL LTGS: 11/17/17    Status  Achieved      PT LONG TERM GOAL #2   Title  Perform FGA and write goal as indicated.     Status  Achieved      PT LONG TERM GOAL #3   Title  Pt will report 0/10 dizziness during all positional testing and bed mobility to improve QOL and safety during functional mobility.     Status  Achieved      PT LONG TERM GOAL #4   Title  Assess cervical spine and write goals as indicated.     Status  Achieved      PT LONG TERM GOAL #5   Title  Pt will amb. 1000' over even/uneven terrain, while performing head turns, without LOB or dizziness to improve functional mobility. safety.     Status  Achieved      PT LONG TERM GOAL #6   Title  Pt will improve FGA score to >/=30/30 to decr. falls risk.     Baseline  22/30    Status  Achieved      PT LONG TERM GOAL #7   Title  Pt will perform R cervical rotation WNL and no pain to improve QOL.     Status  Partially Met         Remaining deficits: Intermittent LUE N/T with slight neck pain but much improved.    Education / Equipment: HEP  Plan: Patient agrees to discharge.  Patient goals were met. Patient is being discharged due to meeting the stated rehab goals.  ?????       Geoffry Paradise, PT,DPT 11/25/17 9:15 AM Phone: [phone] Fax: [phone]

## 2017-12-02 ENCOUNTER — Ambulatory Visit: Payer: BLUE CROSS/BLUE SHIELD | Admitting: Physical Therapy

## 2018-02-27 ENCOUNTER — Encounter (HOSPITAL_COMMUNITY): Payer: Self-pay | Admitting: Emergency Medicine

## 2018-02-27 ENCOUNTER — Other Ambulatory Visit: Payer: Self-pay

## 2018-02-27 ENCOUNTER — Emergency Department (HOSPITAL_COMMUNITY)
Admission: EM | Admit: 2018-02-27 | Discharge: 2018-02-27 | Disposition: A | Discharge status: Home / Self Care | Payer: BLUE CROSS/BLUE SHIELD | Attending: Emergency Medicine | Admitting: Emergency Medicine

## 2018-02-27 DIAGNOSIS — R1012 Left upper quadrant pain: Secondary | ICD-10-CM | POA: Diagnosis not present

## 2018-02-27 DIAGNOSIS — R109 Unspecified abdominal pain: Secondary | ICD-10-CM | POA: Diagnosis present

## 2018-02-27 DIAGNOSIS — Z79899 Other long term (current) drug therapy: Secondary | ICD-10-CM | POA: Insufficient documentation

## 2018-02-27 DIAGNOSIS — Z7982 Long term (current) use of aspirin: Secondary | ICD-10-CM | POA: Diagnosis not present

## 2018-02-27 HISTORY — DX: Pure hypercholesterolemia, unspecified: E78.00

## 2018-02-27 LAB — CBC WITH DIFFERENTIAL/PLATELET
Basophils Absolute: 0 10*3/uL (ref 0.0–0.1)
Basophils Relative: 0 %
EOS PCT: 4 %
Eosinophils Absolute: 0.2 10*3/uL (ref 0.0–0.7)
HEMATOCRIT: 41.5 % (ref 39.0–52.0)
Hemoglobin: 14.2 g/dL (ref 13.0–17.0)
LYMPHS ABS: 2.6 10*3/uL (ref 0.7–4.0)
LYMPHS PCT: 51 %
MCH: 32.1 pg (ref 26.0–34.0)
MCHC: 34.2 g/dL (ref 30.0–36.0)
MCV: 93.7 fL (ref 78.0–100.0)
Monocytes Absolute: 0.6 10*3/uL (ref 0.1–1.0)
Monocytes Relative: 12 %
Neutro Abs: 1.7 10*3/uL (ref 1.7–7.7)
Neutrophils Relative %: 33 %
Platelets: 274 10*3/uL (ref 150–400)
RBC: 4.43 MIL/uL (ref 4.22–5.81)
RDW: 12.3 % (ref 11.5–15.5)
WBC: 5.1 10*3/uL (ref 4.0–10.5)

## 2018-02-27 LAB — COMPREHENSIVE METABOLIC PANEL
ALBUMIN: 4.2 g/dL (ref 3.5–5.0)
ALT: 20 U/L (ref 0–44)
AST: 24 U/L (ref 15–41)
Alkaline Phosphatase: 52 U/L (ref 38–126)
Anion gap: 5 (ref 5–15)
BUN: 9 mg/dL (ref 6–20)
CHLORIDE: 107 mmol/L (ref 98–111)
CO2: 27 mmol/L (ref 22–32)
Calcium: 9.5 mg/dL (ref 8.9–10.3)
Creatinine, Ser: 1.12 mg/dL (ref 0.61–1.24)
GFR calc Af Amer: >60 mL/min (ref >60)
GFR calc non Af Amer: >60 mL/min (ref >60)
GLUCOSE: 100 mg/dL — AB (ref 70–99)
Potassium: 4.1 mmol/L (ref 3.5–5.1)
Sodium: 139 mmol/L (ref 135–145)
Total Bilirubin: 1.5 mg/dL — ABNORMAL HIGH (ref 0.3–1.2)
Total Protein: 7.2 g/dL (ref 6.5–8.1)

## 2018-02-27 LAB — LIPASE, BLOOD: Lipase: 32 U/L (ref 11–51)

## 2018-02-27 MED ORDER — PANTOPRAZOLE SODIUM 40 MG PO TBEC: 40.0000 mg | DELAYED_RELEASE_TABLET | Freq: Every day | ORAL | 0 refills | Status: AC

## 2018-02-27 NOTE — ED Triage Notes (Signed)
Patient c/o left upper abd pain that has been intermittent x1 week. Denies any nausea, vomiting, diarrhea, urinary symptoms, fevers, or shortness of breath. Per patient just started a "statin" for high cholesterol two weeks ago.

## 2018-02-27 NOTE — ED Provider Notes (Signed)
[facility] EMERGENCY DEPARTMENT Provider Note   CSN: [id number] Arrival date & time: 02/27/18  [place]     History   Chief Complaint Chief Complaint  Patient presents with  . Abdominal Pain    HPI Ricky Wade is a 52 y.o. male.  HPI  52 year old male presents with left upper quadrant abdominal pain.  Has been on and off for about 2 weeks.  He wonders if it is due to starting cholesterol medicine though he cannot remember exactly how long ago he started on that.  He does not know the exact name of the medicine.  He states the pain feels like a burning sensation and also like a bloating.  It comes and goes.  Occasionally when he bends over it will be worse and wonders if is due to sit ups he did a few weeks ago but is not sure if he did it before after the onset of the pain.  There is no chest pain, shortness of breath, vomiting, diarrhea, constipation or hematochezia/melena.  He has not tried anything for the pain.  Currently there is no pain at the moment I am talking to him.  No obvious change with food. Is not exertional.  Past Medical History:  Diagnosis Date  . High cholesterol     Patient Active Problem List   Diagnosis Date Noted  . Cervicalgia 09/24/2017  . Vestibulitis of ear, unspecified laterality 09/24/2017  . 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 Rectal bleeding 12/29/2011    Past Surgical History:  Procedure Laterality Date  . COLONOSCOPY  02/10/2012   Procedure: COLONOSCOPY;  Surgeon: Danie Binder, MD;  Location: [facility];  Service: Endoscopy;  Laterality: N/A;  1:10  . lipoma removal          Home Medications    Prior to Admission medications   Medication Sig Start Date End Date Taking? Authorizing Provider  aspirin EC 81 MG tablet Take 81 mg by mouth daily.   Yes [provider]  atorvastatin (LIPITOR) 10 MG tablet Take 10 mg by mouth at bedtime.    Yes [provider]  pantoprazole (PROTONIX) 40 MG tablet  Take 1 tablet (40 mg total) by mouth daily. 02/27/18   Sherwood Gambler, MD    Family History Family History  Problem Relation Age of Onset  . Diabetes Mother   . Pancreatic cancer Mother   . Diabetes Father   . Dementia Father   . Colon cancer Neg Hx     Social History Social History   Tobacco Use  . Smoking status: Never Smoker  . Smokeless tobacco: Never Used  Substance Use Topics  . Alcohol use: No  . Drug use: No     Allergies   Patient has no known allergies.   Review of Systems Review of Systems  Respiratory: Negative for shortness of breath.   Cardiovascular: Negative for chest pain.  Gastrointestinal: Positive for abdominal pain. Negative for blood in stool, constipation, diarrhea, nausea and vomiting.  Musculoskeletal: Negative for back pain.  All other systems reviewed and are negative.    Physical Exam Updated Vital Signs BP 117/79 (BP Location: Right Arm)   Pulse (!) 53   Temp 98.1 F (36.7 C) (Oral)   Resp 16   Ht 5\' 6"  (1.676 m)   Wt 73 kg   SpO2 100%   BMI 25.99 kg/m   Physical Exam  Constitutional: He appears well-developed and well-nourished.  Non-toxic appearance. He does not  appear ill.  HENT:  Head: Normocephalic and atraumatic.  Right Ear: External ear normal.  Left Ear: External ear normal.  Nose: Nose normal.  Eyes: Right eye exhibits no discharge. Left eye exhibits no discharge.  Neck: Neck supple.  Cardiovascular: Normal rate, regular rhythm and normal heart sounds.  Pulmonary/Chest: Effort normal and breath sounds normal.  Abdominal: Soft. He exhibits no distension. There is no tenderness.  Musculoskeletal: He exhibits no edema.  Neurological: He is alert.  Skin: Skin is warm and dry.  Psychiatric: His mood appears not anxious.  Nursing note and vitals reviewed.    ED Treatments / Results  Labs (all labs ordered are listed, but only abnormal results are displayed) Labs Reviewed  COMPREHENSIVE METABOLIC PANEL -  Abnormal; Notable for the following components:      Result Value   Glucose, Bld 100 (*)    Total Bilirubin 1.5 (*)    All other components within normal limits  CBC WITH DIFFERENTIAL/PLATELET  LIPASE, BLOOD    EKG EKG Interpretation  Date/Time:  Saturday February 27 2018 18:39:42 EDT Ventricular Rate:  53 PR Interval:    QRS Duration: 88 QT Interval:  407 QTC Calculation: 383 R Axis:   44 Text Interpretation:  Sinus rhythm Abnormal R-wave progression, early transition no acute ST/T changes no significant change since April 2019 Confirmed by Sherwood Gambler [phone]) on 02/27/2018 6:52:39 PM   Radiology No results found.  Procedures Procedures (including critical care time)  Medications Ordered in ED Medications - No data to display   Initial Impression / Assessment and Plan / ED Course  I have reviewed the triage vital signs and the nursing notes.  Pertinent labs & imaging results that were available during my care of the patient were reviewed by me and considered in my medical decision making (see chart for details).     Patient is not currently having any abdominal pain.  His abdominal exam is benign.  This is probably some mild gastritis and I will treat him with PPI.  I doubt ACS or acute chest pathology.  Doubt acute intra-abdominal emergency and do not think emergent CT would be warranted based on the exam findings and his history.  His labs are reassuring.  There is a minimal bilirubin elevation but at 1.5 and with an overall benign history/exam I highly doubt acute cholecystitis or acute gallbladder/biliary pathology.  Plan for discharge home with PPI and follow-up with PCP.  Discussed return precautions.  Final Clinical Impressions(s) / ED Diagnoses   Final diagnoses:  Left upper quadrant pain    ED Discharge Orders         Ordered    pantoprazole (PROTONIX) 40 MG tablet  Daily     02/27/18 1957           Sherwood Gambler, MD 02/27/18 2143

## 2018-02-27 NOTE — Discharge Instructions (Addendum)
If you develop worsening or consistent abdominal pain, chest pain, vomiting or vomiting blood, blood in your stool or black stools, shortness of breath, or any other new/concerning symptoms and return to the ER for evaluation.  Otherwise take the medicine prescribed and follow-up with your primary care physician.

## 2019-04-06 IMAGING — CT CT HEAD W/O CM
4 series · 16 of 47 positions shown, 18 images · non-contrast
Comparison: 02/18/2007

CLINICAL DATA: Dizziness for 2 days

EXAM:
CT HEAD WITHOUT CONTRAST
TECHNIQUE: Contiguous axial images were obtained from the base of the skull
through the vertex without intravenous contrast.

[Series 2: head trauma wo · axial · 0.47mm/px · z∈[+16,+131]mm · 7 of 31 slices shown, 9 images]
[im 4/31  brain]
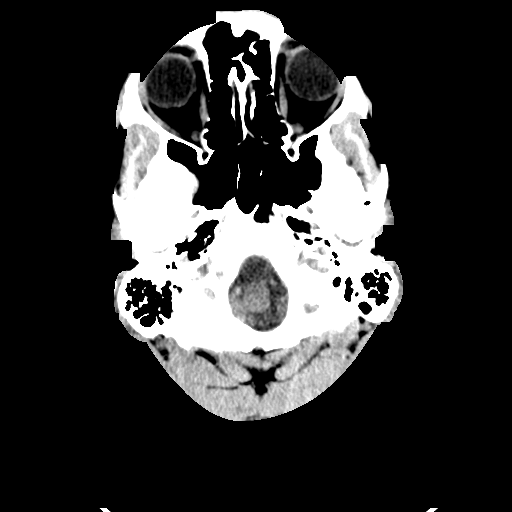
[im 4/31  bone]
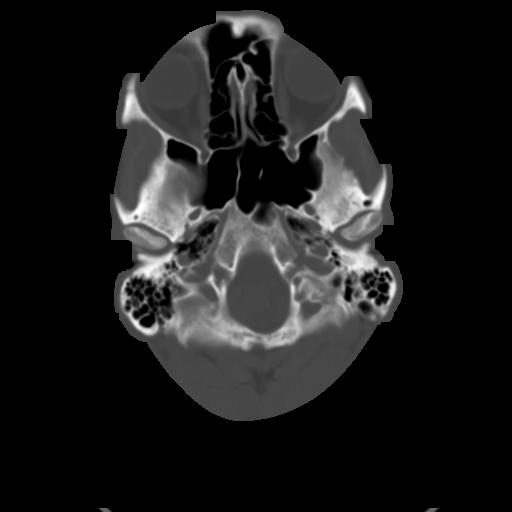
[im 8/31  brain]
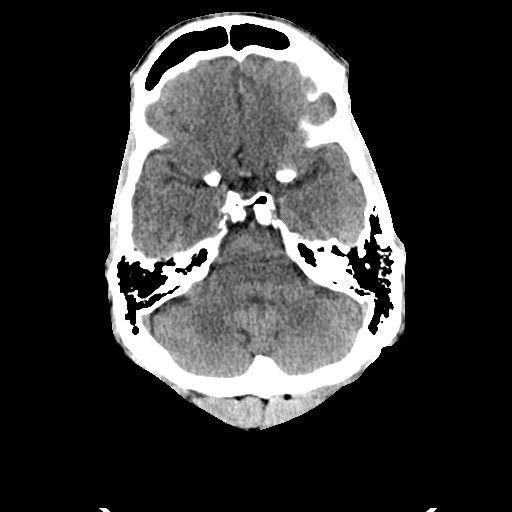
[im 12/31  brain]
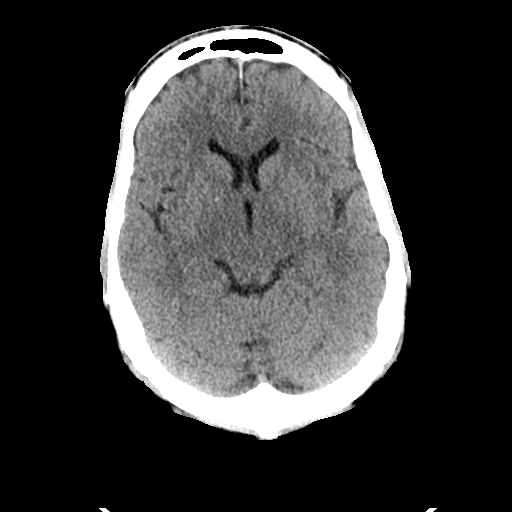
[im 16/31  brain]
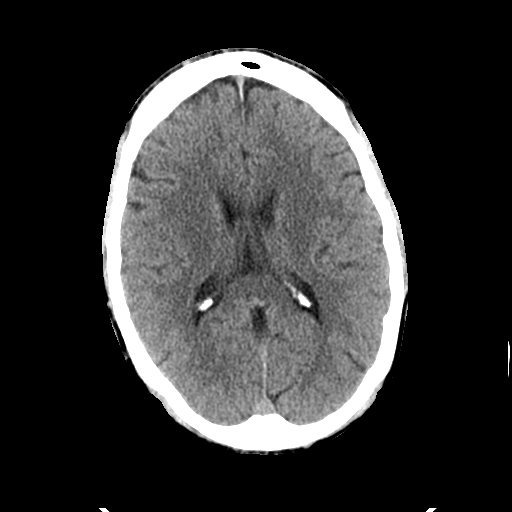
[im 19/31  brain]
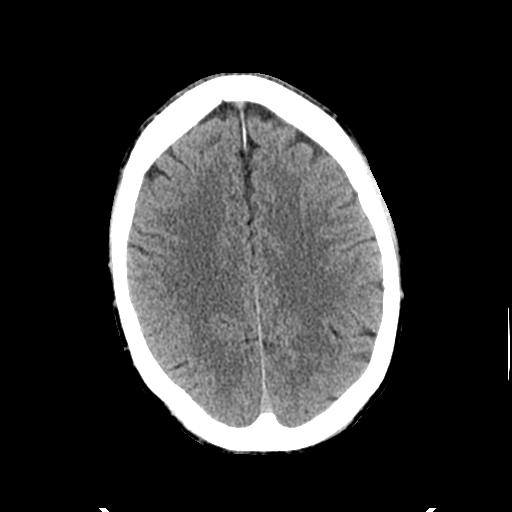
[im 19/31  bone]
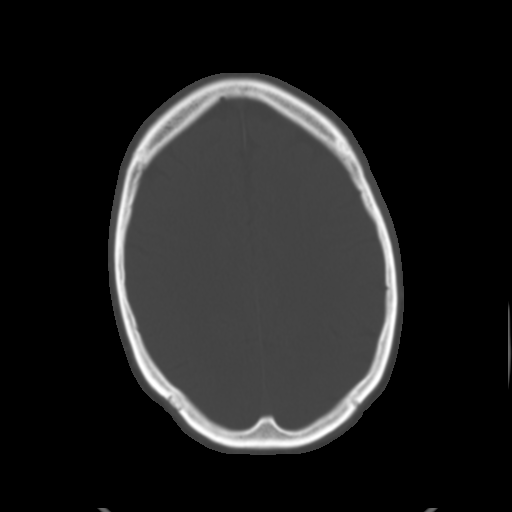
[im 23/31  brain]
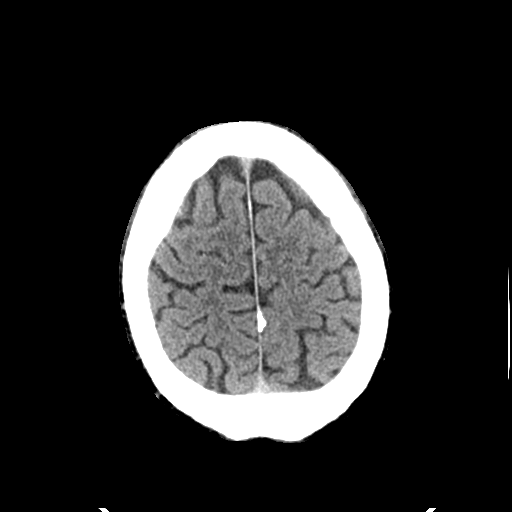
[im 27/31  brain]
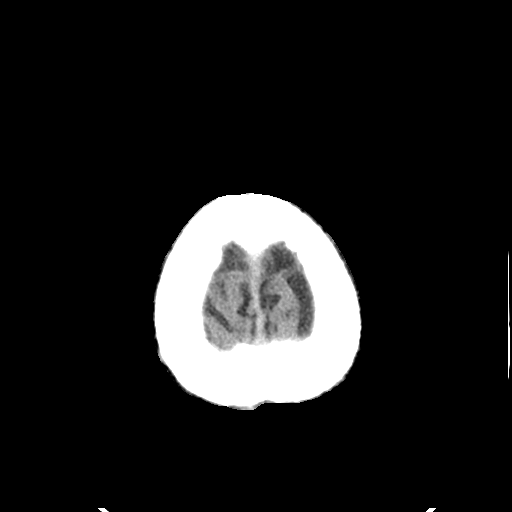

[Series 3: head bone · axial · 0.47mm/px · z∈[+15,+47]mm · 3 of 78 slices shown]
[im 8/78  bone]
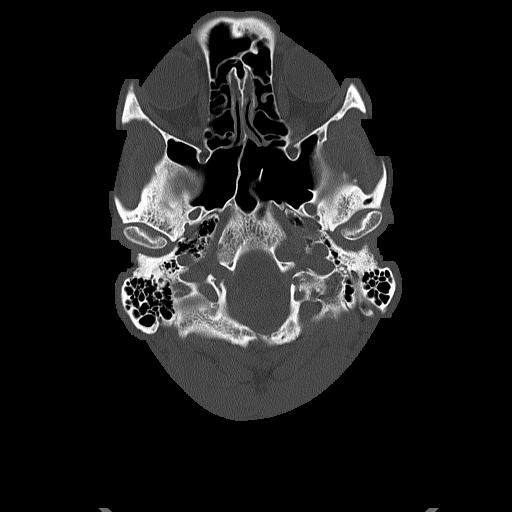
[im 16/78  bone]
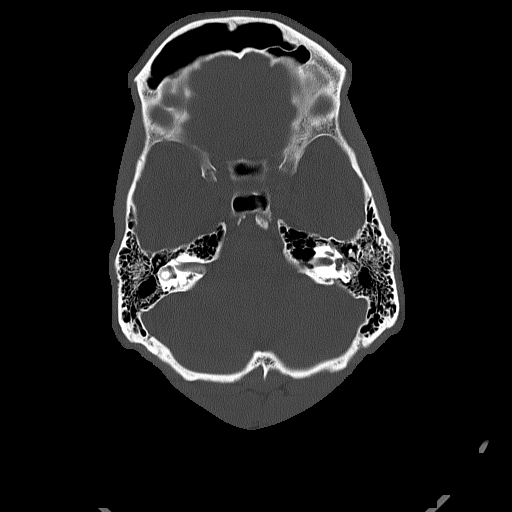
[im 24/78  bone]
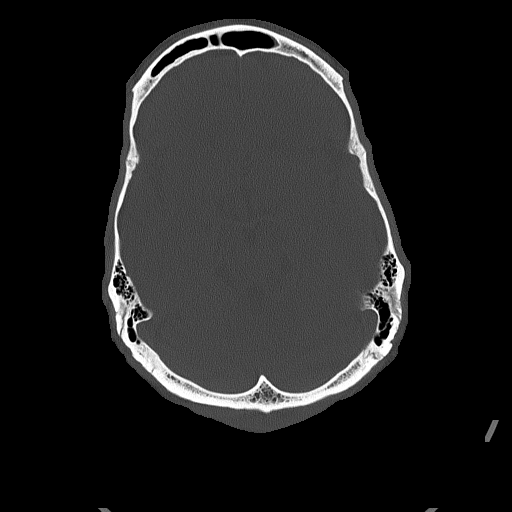

[Series 4: coronal soft tissue · coronal · 0.33mm/px · 3 of 72 slices shown]
[im 24/72  brain]
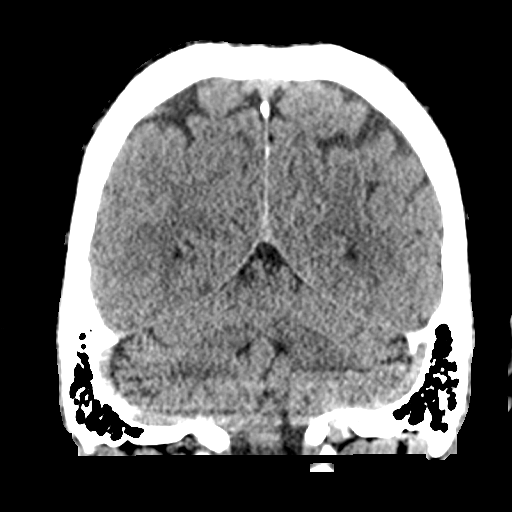
[im 32/72  brain]
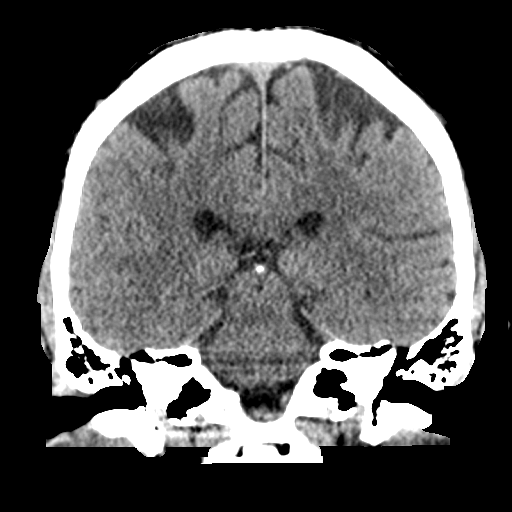
[im 40/72  brain]
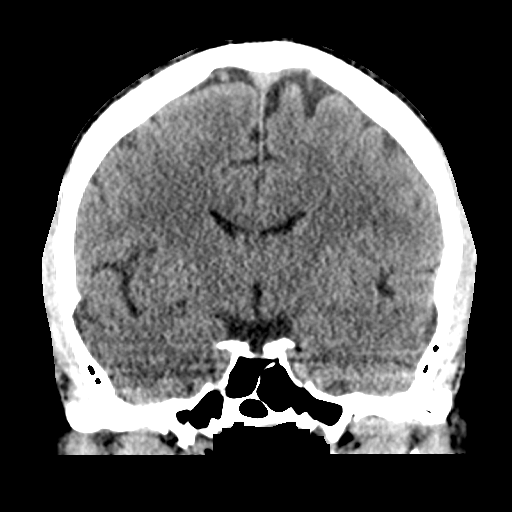

[Series 5: sagittal soft tissue · sagittal · 0.31mm/px · 3 of 56 slices shown]
[im 19/56  brain]
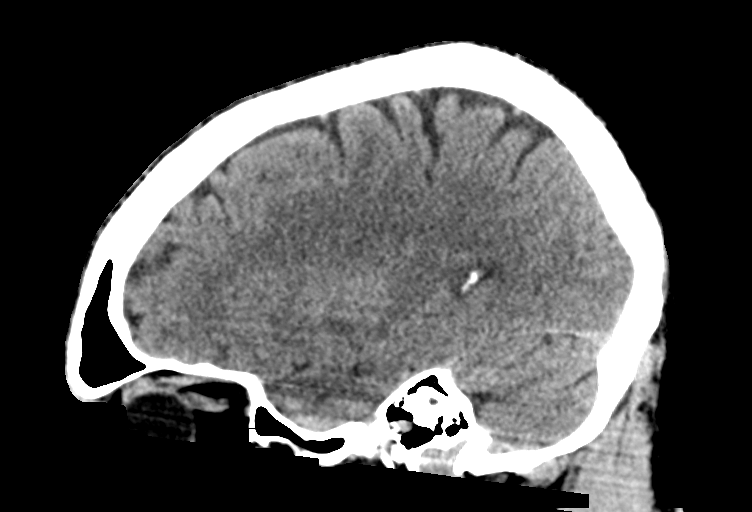
[im 28/56  brain]
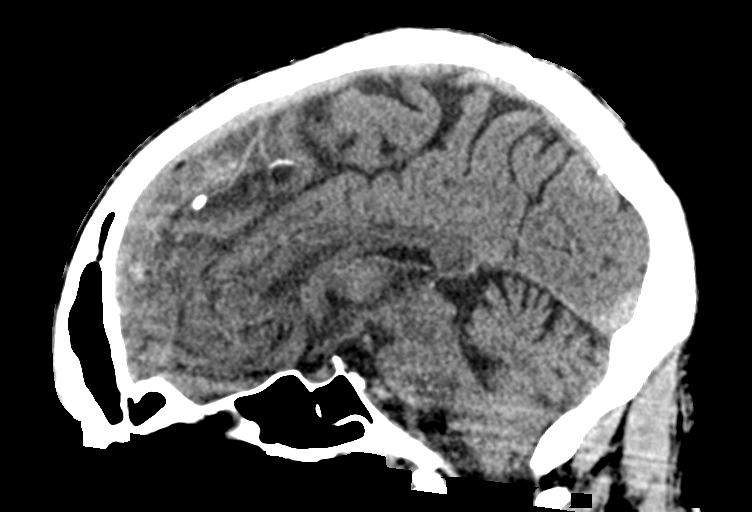
[im 37/56  brain]
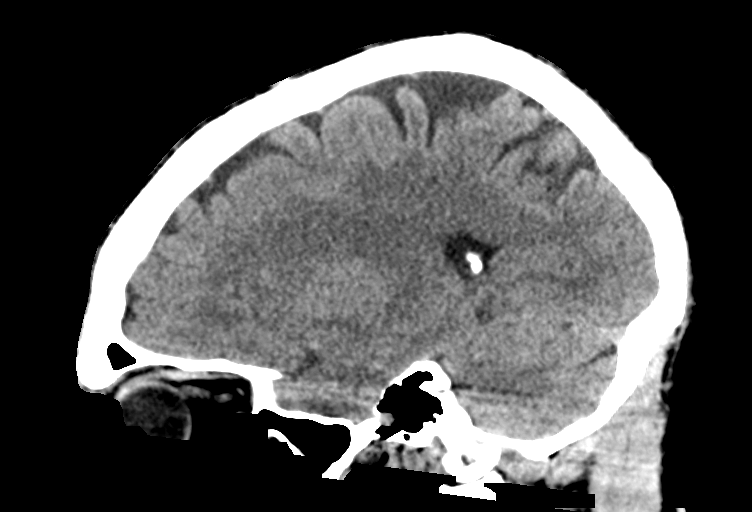

[16 of 47 positions shown; findings below may reference images not displayed]

FINDINGS: Brain: No evidence of acute infarction, hemorrhage, hydrocephalus,
extra-axial collection or mass lesion/mass effect.

Vascular: No hyperdense vessel or unexpected calcification.

Skull: Normal. Negative for fracture or focal lesion.

Sinuses/Orbits: No acute finding.

Other: None.
IMPRESSION: Normal head CT

## 2019-04-25 ENCOUNTER — Telehealth: Payer: Self-pay | Admitting: Neurology

## 2019-04-25 NOTE — Telephone Encounter (Signed)
Pt came into office today stating that he needed a new referral for him to be seen by rehab office. Please advise.

## 2020-11-12 ENCOUNTER — Encounter: Payer: Self-pay | Admitting: Orthopaedic Surgery

## 2020-11-15 ENCOUNTER — Ambulatory Visit: Payer: BC Managed Care – PPO | Admitting: Orthopaedic Surgery

## 2020-11-15 ENCOUNTER — Ambulatory Visit: Payer: BC Managed Care – PPO

## 2020-11-15 ENCOUNTER — Other Ambulatory Visit: Payer: Self-pay

## 2020-11-15 ENCOUNTER — Encounter: Payer: Self-pay | Admitting: Orthopaedic Surgery

## 2020-11-15 VITALS — BP 133/84 | HR 50 | Ht 66.0 in | Wt 160.2 lb

## 2020-11-15 DIAGNOSIS — M542 Cervicalgia: Secondary | ICD-10-CM | POA: Diagnosis not present

## 2020-11-15 MED ORDER — NAPROXEN 500 MG PO TABS: 500.0000 mg | ORAL_TABLET | Freq: Two times a day (BID) | ORAL | 5 refills | Status: AC

## 2020-11-15 MED ORDER — CYCLOBENZAPRINE HCL 10 MG PO TABS: 10.0000 mg | ORAL_TABLET | Freq: Every day | ORAL | 0 refills | Status: DC

## 2020-11-15 NOTE — Progress Notes (Signed)
Subjective:    Patient ID: Ricky Wade, male    DOB: 02-Apr-1966, 55 y.o.   MRN: [id number]  HPI My neck hurts.    He has had neck pain getting worse over the last two months.  He has no trauma.  He has had some TMJ pain and grinds his teeth at night.  I have told him to see dentist for this.  He has more right sided neck pain.  He has no spasm.  He has no numbness.  He has no swelling or redness.  He has tried ice, heat, rubs and Advil with slight help.   Review of Systems  Constitutional:  Positive for activity change.  Musculoskeletal:  Positive for arthralgias, myalgias and neck pain.  All other systems reviewed and are negative. For Review of Systems, all other systems reviewed and are negative.  The following is a summary of the past history medically, past history surgically, known current medicines, social history and family history.  This information is gathered electronically by the computer from prior information and documentation.  I review this each visit and have found including this information at this point in the chart is beneficial and informative.   Past Medical History:  Diagnosis Date   High cholesterol     Past Surgical History:  Procedure Laterality Date   COLONOSCOPY  02/10/2012   Procedure: COLONOSCOPY;  Surgeon: Danie Binder, MD;  Location: [facility];  Service: Endoscopy;  Laterality: N/A;  1:10   lipoma removal      Current Outpatient Medications on File Prior to Visit  Medication Sig Dispense Refill   aspirin EC 81 MG tablet Take 81 mg by mouth daily.     atorvastatin (LIPITOR) 10 MG tablet Take 10 mg by mouth at bedtime.      pantoprazole (PROTONIX) 40 MG tablet Take 1 tablet (40 mg total) by mouth daily. 30 tablet 0   No current facility-administered medications on file prior to visit.    Social History   Socioeconomic History   Marital status: Married    Spouse name: Not on file   Number of children: Not on file   Years of  education: Not on file   Highest education level: Not on file  Occupational History    Employer: [employer]    Comment: [place]  Tobacco Use   Smoking status: Never   Smokeless tobacco: Never  Vaping Use   Vaping Use: Never used  Substance and Sexual Activity   Alcohol use: No   Drug use: No   Sexual activity: Not on file  Other Topics Concern   Not on file  Social History Narrative   Not on file   Social Determinants of Health   Financial Resource Strain: Not on file  Food Insecurity: Not on file  Transportation Needs: Not on file  Physical Activity: Not on file  Stress: Not on file  Social Connections: Not on file  Intimate Partner Violence: Not on file    Family History  Problem Relation Age of Onset   Diabetes Mother    Pancreatic cancer Mother    Diabetes Father    Dementia Father    Colon cancer Neg Hx     BP 133/84   Pulse (!) 50   Ht 5\' 6"  (1.676 m)   Wt 160 lb 3.2 oz (72.7 kg)   BMI 25.86 kg/m   Body mass index is 25.86 kg/m.     Objective:   Physical Exam  Vitals and nursing note reviewed. Exam conducted with a chaperone present.  Constitutional:      Appearance: He is well-developed.  HENT:     Head: Normocephalic and atraumatic.  Eyes:     Conjunctiva/sclera: Conjunctivae normal.     Pupils: Pupils are equal, round, and reactive to light.  Cardiovascular:     Rate and Rhythm: Normal rate and regular rhythm.  Pulmonary:     Effort: Pulmonary effort is normal.  Abdominal:     Palpations: Abdomen is soft.  Musculoskeletal:       Arms:     Cervical back: Normal range of motion and neck supple.  Skin:    General: Skin is warm and dry.  Neurological:     Mental Status: He is alert and oriented to person, place, and time.     Cranial Nerves: No cranial nerve deficit.     Motor: No abnormal muscle tone.     Coordination: Coordination normal.     Deep Tendon Reflexes: Reflexes are normal and symmetric. Reflexes normal.  Psychiatric:         Behavior: Behavior normal.        Thought Content: Thought content normal.        Judgment: Judgment normal.  X-rays of the cervical spine were done, reported separately.  He has loss of normal cervical lordosis and DJD changes.        Assessment & Plan:   Encounter Diagnosis  Name Primary?   Neck pain Yes   I will begin PT for the neck.  I will begin Flexeril and Naprosyn.  Precautions discussed.  He will see dentist about TMJ.  Return in three weeks.  He may need MRI of the cervical spine.  Call if any problem.  Precautions discussed.  Electronically Signed Sanjuana Kava, MD 6/23/202211:00 AM

## 2020-11-20 ENCOUNTER — Ambulatory Visit (HOSPITAL_COMMUNITY): Payer: BC Managed Care – PPO | Attending: Orthopaedic Surgery

## 2020-11-20 ENCOUNTER — Encounter (HOSPITAL_COMMUNITY): Payer: Self-pay

## 2020-11-20 ENCOUNTER — Other Ambulatory Visit: Payer: Self-pay

## 2020-11-20 DIAGNOSIS — M542 Cervicalgia: Secondary | ICD-10-CM | POA: Insufficient documentation

## 2020-11-20 DIAGNOSIS — R29898 Other symptoms and signs involving the musculoskeletal system: Secondary | ICD-10-CM | POA: Diagnosis present
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
## 2020-11-20 NOTE — Patient Instructions (Signed)
Access Code: [email or web address] URL: [email or web address] Date: 11/20/2020 Prepared by: Sherlyn Lees  Exercises Mid-Lower Cervical Extension SNAG with Strap - 1 x daily - 7 x weekly - 3 sets - 10 reps Seated Assisted Cervical Rotation with Towel - 1 x daily - 7 x weekly - 3 sets - 10 reps Supine Cervical Retraction with Towel - 1 x daily - 7 x weekly - 3 sets - 10 reps

## 2020-11-28 ENCOUNTER — Other Ambulatory Visit: Payer: Self-pay

## 2020-11-28 ENCOUNTER — Ambulatory Visit (HOSPITAL_COMMUNITY): Payer: BC Managed Care – PPO | Attending: Orthopaedic Surgery

## 2020-11-28 ENCOUNTER — Encounter (HOSPITAL_COMMUNITY): Payer: Self-pay

## 2020-11-28 DIAGNOSIS — M542 Cervicalgia: Secondary | ICD-10-CM | POA: Diagnosis present

## 2020-11-28 DIAGNOSIS — R29898 Other symptoms and signs involving the musculoskeletal system: Secondary | ICD-10-CM | POA: Diagnosis present

## 2020-11-28 NOTE — Therapy (Signed)
[facility] [address] [place], [place], [address] Phone: [phone]   Fax:  [phone]  Physical Therapy Treatment  Patient Details  Name: Ricky Wade MRN: [id number] Date of Birth: April 21, 1966 Referring Provider (PT): Sanjuana Kava   Encounter Date: 11/28/2020   PT End of Session - 11/28/20 1459     Visit Number 2    Number of Visits 4    Date for PT Re-Evaluation 12/18/20    Authorization Type BCBS Comm PPO, no auth, VL 44    Authorization - Visit Number 2    Authorization - Number of Visits 60    PT Start Time 1450    PT Stop Time 1528    PT Time Calculation (min) 38 min    Activity Tolerance Patient tolerated treatment well    Behavior During Therapy [facility] for tasks assessed/performed             Past Medical History:  Diagnosis Date   High cholesterol     Past Surgical History:  Procedure Laterality Date   COLONOSCOPY  02/10/2012   Procedure: COLONOSCOPY;  Surgeon: Danie Binder, MD;  [facility];  Service: Endoscopy;  Laterality: N/A;  1:10   lipoma removal      There were no vitals filed for this visit.   Subjective Assessment - 11/28/20 1455     Subjective Pt stated neck pain scale 6/10 soreness.  Stated he has began HEP and ordered traction unit that has not arrived.  Reports some relief following additional towel assistance wiht sleep last night, though had some pain on Rt side of head.    Currently in Pain? Yes    Pain Score 6     Pain Location Neck    Pain Orientation Right    Pain Descriptors / Indicators Sore;Tightness    Pain Type Chronic pain    Pain Radiating Towards denies    Pain Onset More than a month ago    Pain Frequency Intermittent    Aggravating Factors  right head/neck rotation    Pain Relieving Factors laying on his back                               [facility] Adult PT Treatment/Exercise - 11/28/20 0001       Exercises   Exercises Neck       Neck Exercises: Seated   Shoulder Rolls Backwards    Shoulder Rolls Limitations up, back and down    Other Seated Exercise cervical SNAG for extension and rotation    Other Seated Exercise 3D cervical excursion in pain free range 10x      Manual Therapy   Manual Therapy Soft tissue mobilization;Manual Traction    Manual therapy comments Manual complete separate than rest of tx    Soft tissue mobilization Upper trap and cervical mm    Manual Traction Manual cervical traction 4x 54min                    PT Education - 11/28/20 1505     Education Details Reviewed goals, educated importance of HEP compliance, pt able to recall towel strech though required cueing for proper form to assist with ROM    Person(s) Educated Patient    Methods Explanation;Tactile cues;Verbal cues    Comprehension Verbalized understanding;Returned demonstration;Need further instruction  PT Short Term Goals - 11/20/20 0946       PT SHORT TERM GOAL #1   Title Patient will be independent with HEP in order to improve functional outcomes.    Time 2    Period Weeks    Status New    Target Date 12/04/20      PT SHORT TERM GOAL #2   Title Patient will report at least 25% improvement in symptoms for improved quality of life    Baseline 8/10 right neck pain at night    Time 2    Period Weeks    Status New    Target Date 12/04/20               PT Long Term Goals - 11/20/20 0947       PT LONG TERM GOAL #1   Title Patient will report at least 75% improvement in symptoms for improved quality of life.    Baseline 8/10 neck pain    Time 4    Period Weeks    Status New    Target Date 12/18/20      PT LONG TERM GOAL #2   Title PAtient will demo right neck rotation and sidebending WNL    Baseline 10% rotation limitation, 25% side bending limitation    Time 4    Period Weeks    Status New    Target Date 12/18/20                   Plan - 11/28/20 1459      Clinical Impression Statement Reviewed goals, educated importance of HEP compliance, pt able to recall towel exercise though required tactile and verbal cueing for proper form to improve cervical rotation.  Session focus on cervical mobility in pain free range.  Pt with tendency to activate Rt UT with all head movements, encouraged to relax and improve awareness with daily tasks.  EOS with manual soft tissue massage and manual cervical traction wiht reports of relief following.    Personal Factors and Comorbidities Age;Profession    Examination-Activity Limitations Sleep;Lift;Carry    Examination-Participation Restrictions Occupation    Stability/Clinical Decision Making Stable/Uncomplicated    Clinical Decision Making Low    Rehab Potential Good    PT Frequency 1x / week    PT Duration 4 weeks    PT Treatment/Interventions Electrical Stimulation;Traction;Moist Heat;Functional mobility training;Therapeutic activities;Therapeutic exercise;Manual techniques;Passive range of motion;Spinal Manipulations;Joint Manipulations    PT Next Visit Plan Cervical mobility, cervical traction;  add chin tucks and UT stretch next session.    PT Home Exercise Plan extension and rotation SNAG, cervical roll for sleeping    Consulted and Agree with Plan of Care Patient             Patient will benefit from skilled therapeutic intervention in order to improve the following deficits and impairments:  Hypomobility, Increased muscle spasms, Pain, Postural dysfunction  Visit Diagnosis: Neck pain  Other symptoms and signs involving the musculoskeletal system     Problem List Patient Active Problem List   Diagnosis Date Noted   Cervicalgia 09/24/2017   Vestibulitis of ear, unspecified laterality 09/24/2017   Vertigo of cervical arthrosis syndrome 09/24/2017   Rectal bleeding 12/29/2011   Ihor Austin, LPTA/CLT; CBIS [phone]  Aldona Lento 11/28/2020, 4:31 PM  [facility] [address] [place], [place], [address] Phone: [phone]   Fax:  [phone]  Name: Ricky Wade MRN: [id number] Date  of Birth: July 12, 1965

## 2020-12-04 ENCOUNTER — Encounter (HOSPITAL_COMMUNITY): Payer: Self-pay

## 2020-12-04 ENCOUNTER — Ambulatory Visit (HOSPITAL_COMMUNITY): Payer: BC Managed Care – PPO

## 2020-12-04 ENCOUNTER — Other Ambulatory Visit: Payer: Self-pay

## 2020-12-04 DIAGNOSIS — R29898 Other symptoms and signs involving the musculoskeletal system: Secondary | ICD-10-CM

## 2020-12-04 DIAGNOSIS — M542 Cervicalgia: Secondary | ICD-10-CM | POA: Diagnosis not present

## 2020-12-04 NOTE — Therapy (Signed)
[facility] [place], [place], [address] Phone: [phone]   Fax:  [phone]  Physical Therapy Treatment  Patient Details  Name: Ricky Wade MRN: [id number] Date of Birth: 1965-09-05 Referring Provider (PT): Sanjuana Kava   Encounter Date: 12/04/2020   PT End of Session - 12/04/20 1601     Visit Number 3    Number of Visits 4    Date for PT Re-Evaluation 12/18/20    Authorization Type [employer] Comm PPO, no auth, VL 60    Authorization - Visit Number 3    Authorization - Number of Visits 60    PT Start Time 1600    PT Stop Time 1645    PT Time Calculation (min) 45 min    Activity Tolerance Patient tolerated treatment well    Behavior During Therapy [facility] for tasks assessed/performed             Past Medical History:  Diagnosis Date   High cholesterol     Past Surgical History:  Procedure Laterality Date   COLONOSCOPY  02/10/2012   Procedure: COLONOSCOPY;  Surgeon: Danie Binder, MD;  [facility];  Service: Endoscopy;  Laterality: N/A;  1:10   lipoma removal      There were no vitals filed for this visit.   Subjective Assessment - 12/04/20 1606     Subjective Reports he has been feeling better and sleeping better at night with cervical support    Pain Onset More than a month ago                [facility] PT Assessment - 12/04/20 0001       Assessment   Medical Diagnosis M54.2 neck pain    Referring Provider (PT) Sanjuana Kava    Next MD Visit 12/06/20                           [facility] Adult PT Treatment/Exercise - 12/04/20 0001       Neck Exercises: Seated   Other Seated Exercise levator scap stretch/side bend self-mobilization      Neck Exercises: Supine   Neck Retraction 20 reps;3 secs    Capital Flexion 20 reps;3 secs      Manual Therapy   Manual Therapy Soft tissue mobilization;Manual Traction    Manual therapy comments Manual complete separate than rest of  tx    Soft tissue mobilization Upper trap and cervical mm    Manual Traction Manual cervical traction 6x 25min                    PT Education - 12/04/20 1644     Education Details education in HEP refinement, self-mobilization, over-the-door traction unit    Person(s) Educated Patient    Methods Explanation;Demonstration    Comprehension Verbalized understanding              PT Short Term Goals - 11/20/20 0946       PT SHORT TERM GOAL #1   Title Patient will be independent with HEP in order to improve functional outcomes.    Time 2    Period Weeks    Status New    Target Date 12/04/20      PT SHORT TERM GOAL #2   Title Patient will report at least 25% improvement in symptoms for improved quality of life    Baseline 8/10 right neck pain at night  Time 2    Period Weeks    Status New    Target Date 12/04/20               PT Long Term Goals - 11/20/20 0947       PT LONG TERM GOAL #1   Title Patient will report at least 75% improvement in symptoms for improved quality of life.    Baseline 8/10 neck pain    Time 4    Period Weeks    Status New    Target Date 12/18/20      PT LONG TERM GOAL #2   Title PAtient will demo right neck rotation and sidebending WNL    Baseline 10% rotation limitation, 25% side bending limitation    Time 4    Period Weeks    Status New    Target Date 12/18/20                   Plan - 12/04/20 1631     Clinical Impression Statement Decreased right sidebending appreciated with decrease in down-glide evident with manuever and notes feeling localized restriction along right cervical column. Pt notes decrease in pain following traction and self-mobilization maneuvers. Educated/demonstrated at home over-the-door cervical traction unit for decompression. Continued x 1 visit to reassess and refine for D/C to HEP    Personal Factors and Comorbidities Age;Profession    Examination-Activity Limitations Sleep;Lift;Carry     Examination-Participation Restrictions Occupation    Stability/Clinical Decision Making Stable/Uncomplicated    Rehab Potential Good    PT Frequency 1x / week    PT Duration 4 weeks    PT Treatment/Interventions Electrical Stimulation;Traction;Moist Heat;Functional mobility training;Therapeutic activities;Therapeutic exercise;Manual techniques;Passive range of motion;Spinal Manipulations;Joint Manipulations    PT Next Visit Plan Cervical mobility, cervical traction;  add chin tucks and UT stretch next session.    PT Home Exercise Plan extension and rotation SNAG, cervical roll for sleeping, supine neck retraction, cervical flexion (supine), side-bend self-mobilization    Consulted and Agree with Plan of Care Patient             Patient will benefit from skilled therapeutic intervention in order to improve the following deficits and impairments:  Hypomobility, Increased muscle spasms, Pain, Postural dysfunction  Visit Diagnosis: Neck pain  Other symptoms and signs involving the musculoskeletal system     Problem List Patient Active Problem List   Diagnosis Date Noted   Cervicalgia 09/24/2017   Vestibulitis of ear, unspecified laterality 09/24/2017   Vertigo of cervical arthrosis syndrome 09/24/2017   Rectal bleeding 12/29/2011   4:45 PM, 12/04/20 M. Sherlyn Lees, PT, DPT Physical Therapist- [place] Office Number: [phone]   [facility] [address] [place], [place], [address] Phone: [phone]   Fax:  [phone]  Name: Ricky Wade MRN: [id number] Date of Birth: May 25, 1966

## 2020-12-06 ENCOUNTER — Ambulatory Visit: Payer: BC Managed Care – PPO | Admitting: Orthopaedic Surgery

## 2020-12-12 ENCOUNTER — Ambulatory Visit (HOSPITAL_COMMUNITY): Payer: BC Managed Care – PPO

## 2020-12-12 ENCOUNTER — Other Ambulatory Visit: Payer: Self-pay

## 2020-12-12 DIAGNOSIS — R29898 Other symptoms and signs involving the musculoskeletal system: Secondary | ICD-10-CM

## 2020-12-12 DIAGNOSIS — M542 Cervicalgia: Secondary | ICD-10-CM | POA: Diagnosis not present

## 2020-12-12 NOTE — Therapy (Signed)
[facility] [address] [place], [place], [address] Phone: [phone]   Fax:  [phone]  Physical Therapy Treatment and D/C Summary  Patient Details  Name: Ricky Wade MRN: [id number] Date of Birth: [birth year] Referring Provider (PT): Sanjuana Kava  PHYSICAL THERAPY DISCHARGE SUMMARY  Visits from Start of Care: 4  Current functional level related to goals / functional outcomes: Met all but 1 LTG. Demonstrates and reports improved symptoms since [facility]   Remaining deficits: Some restriction in rotation/sidebending at end-range   Education / Equipment: HEP. Educated on cervical roll for sleeping, at-home traction unit   Patient agrees to discharge. Patient goals were partially met. Patient is being discharged due to being pleased with the current functional level.  Encounter Date: 12/12/2020   PT End of Session - 12/12/20 1520     Visit Number 4    Number of Visits 4    Date for PT Re-Evaluation 12/18/20    Authorization Type BCBS Comm PPO, no auth, VL 60    Authorization - Visit Number 4    Authorization - Number of Visits 60    PT Start Time 1430    PT Stop Time 1515    PT Time Calculation (min) 45 min    Activity Tolerance Patient tolerated treatment well    Behavior During Therapy [facility] for tasks assessed/performed             Past Medical History:  Diagnosis Date   High cholesterol     Past Surgical History:  Procedure Laterality Date   COLONOSCOPY  02/10/2012   Procedure: COLONOSCOPY;  Surgeon: Danie Binder, MD;  [facility];  Service: Endoscopy;  Laterality: N/A;  1:10   lipoma removal      There were no vitals filed for this visit.   Subjective Assessment - 12/12/20 1438     Subjective Note some improvement with sleeping position and decrease in pain    Pain Onset More than a month ago                [facility] PT Assessment - 12/12/20 0001       Assessment   Medical Diagnosis  M54.2 neck pain    Referring Provider (PT) Sanjuana Kava      AROM   Cervical Flexion WNL    Cervical Extension WNL    Cervical - Right Side Bend 10% limited    Cervical - Left Side Bend WNL    Cervical - Right Rotation WNL, pain at end-range    Cervical - Left Rotation WNL                           [facility] Adult PT Treatment/Exercise - 12/12/20 0001       Neck Exercises: Seated   Shoulder Rolls Backwards    Shoulder Rolls Limitations up, back and down    Other Seated Exercise cervical SNAG for extension and rotation    Other Seated Exercise levator scap stretch/side bend self-mobilization      Neck Exercises: Supine   Neck Retraction 20 reps;3 secs    Capital Flexion 20 reps;3 secs      Manual Therapy   Manual Therapy Soft tissue mobilization;Manual Traction    Manual therapy comments Manual complete separate than rest of tx    Soft tissue mobilization Upper trap and cervical mm left cervical column > right due to spasm present left  PT Education - 12/12/20 1519     Education Details eduacted/demonstrated rear-view mirror for baseball hat to decrease need for head/neck rotation when backing qork equipment. Demonstrtaed McKenzie cervical roll    Person(s) Educated Patient    Methods Explanation    Comprehension Verbalized understanding              PT Short Term Goals - 12/12/20 1441       PT SHORT TERM GOAL #1   Title Patient will be independent with HEP in order to improve functional outcomes.    Baseline independent    Time 2    Period Weeks    Status Achieved    Target Date 12/04/20      PT SHORT TERM GOAL #2   Title Patient will report at least 25% improvement in symptoms for improved quality of life    Baseline 4/10 right neck pain at night; 50% improvement    Time 2    Period Weeks    Status Achieved    Target Date 12/04/20               PT Long Term Goals - 12/12/20 1442       PT LONG TERM  GOAL #1   Title Patient will report at least 75% improvement in symptoms for improved quality of life.    Baseline 4/10 neck pain; 50% improvement    Time 4    Period Weeks    Status Not Met      PT LONG TERM GOAL #2   Title PAtient will demo right neck rotation and sidebending WNL    Baseline rotation WNL but notes discomfort in left cervical column with end-range right rotation and sidebending    Time 4    Period Weeks    Status Achieved                   Plan - 12/12/20 1520     Clinical Impression Statement Demonstrates improved cervical ROM and reports improved sleeping position/tolerance. Some ROM restrictions still present and pt educated on mgmt techniuqes and explanation of anatomy for understanding. Pt able to partially meet STG/LTG and demonstrates independent HEP and self-management practices. Will D/C to medical care and HEP at this time    Personal Factors and Comorbidities Age;Profession    Examination-Activity Limitations Sleep;Lift;Carry    Examination-Participation Restrictions Occupation    Stability/Clinical Decision Making Stable/Uncomplicated    Rehab Potential Good    PT Frequency 1x / week    PT Duration 4 weeks    PT Treatment/Interventions Electrical Stimulation;Traction;Moist Heat;Functional mobility training;Therapeutic activities;Therapeutic exercise;Manual techniques;Passive range of motion;Spinal Manipulations;Joint Manipulations    PT Next Visit Plan HEP    PT Home Exercise Plan extension and rotation SNAG, cervical roll for sleeping, supine neck retraction, cervical flexion (supine), side-bend self-mobilization    Consulted and Agree with Plan of Care Patient             Patient will benefit from skilled therapeutic intervention in order to improve the following deficits and impairments:  Hypomobility, Increased muscle spasms, Pain, Postural dysfunction  Visit Diagnosis: Neck pain  Other symptoms and signs involving the  musculoskeletal system     Problem List Patient Active Problem List   Diagnosis Date Noted   Cervicalgia 09/24/2017   Vestibulitis of ear, unspecified laterality 09/24/2017   Vertigo of cervical arthrosis syndrome 09/24/2017   Rectal bleeding 12/29/2011   3:24 PM, 12/12/20 M. Sherlyn Lees, PT, DPT Physical Therapist-  [facility] Office Number: [facility] [address] [place], [place], [address] Phone: [phone]   Fax:  [phone]  Name: Ricky Wade MRN: [id number] Date of Birth: 30-Sep-1965

## 2020-12-13 ENCOUNTER — Encounter: Payer: Self-pay | Admitting: Orthopaedic Surgery

## 2020-12-13 ENCOUNTER — Ambulatory Visit: Payer: BC Managed Care – PPO | Admitting: Orthopaedic Surgery

## 2020-12-13 VITALS — BP 129/79 | HR 56 | Ht 67.0 in | Wt 161.0 lb

## 2020-12-13 DIAGNOSIS — M542 Cervicalgia: Secondary | ICD-10-CM

## 2020-12-13 NOTE — Progress Notes (Signed)
I am much better  He went to PT.  I have reviewed their notes.  He has much less neck pain.  He has not seen his dentist yet about the TMJ pain.  ROM of neck is full.  NV intact.  Encounter Diagnosis  Name Primary?   Neck pain Yes   I will see as needed.  Call if any problem.  Precautions discussed.  Electronically Signed Sanjuana Kava, MD 7/21/202211:07 AM

## 2020-12-18 ENCOUNTER — Ambulatory Visit (HOSPITAL_COMMUNITY): Payer: BC Managed Care – PPO

## 2022-01-08 ENCOUNTER — Encounter: Payer: Self-pay | Admitting: *Deleted

## 2022-03-08 ENCOUNTER — Emergency Department (HOSPITAL_COMMUNITY)
Admission: EM | Admit: 2022-03-08 | Discharge: 2022-03-08 | Disposition: A | Discharge status: Home / Self Care | Payer: BC Managed Care – PPO | Attending: Emergency Medicine | Admitting: Emergency Medicine

## 2022-03-08 ENCOUNTER — Other Ambulatory Visit: Payer: Self-pay

## 2022-03-08 ENCOUNTER — Encounter (HOSPITAL_COMMUNITY): Payer: Self-pay | Admitting: Emergency Medicine

## 2022-03-08 ENCOUNTER — Emergency Department (HOSPITAL_COMMUNITY): Payer: BC Managed Care – PPO

## 2022-03-08 DIAGNOSIS — X500XXA Overexertion from strenuous movement or load, initial encounter: Secondary | ICD-10-CM | POA: Insufficient documentation

## 2022-03-08 DIAGNOSIS — Z7982 Long term (current) use of aspirin: Secondary | ICD-10-CM | POA: Diagnosis not present

## 2022-03-08 DIAGNOSIS — R079 Chest pain, unspecified: Secondary | ICD-10-CM | POA: Diagnosis present

## 2022-03-08 DIAGNOSIS — Y990 Civilian activity done for income or pay: Secondary | ICD-10-CM | POA: Diagnosis not present

## 2022-03-08 DIAGNOSIS — R42 Dizziness and giddiness: Secondary | ICD-10-CM | POA: Insufficient documentation

## 2022-03-08 LAB — CBC
HCT: 39.7 % (ref 39.0–52.0)
Hemoglobin: 13.6 g/dL (ref 13.0–17.0)
MCH: 31.9 pg (ref 26.0–34.0)
MCHC: 34.3 g/dL (ref 30.0–36.0)
MCV: 93 fL (ref 80.0–100.0)
Platelets: 291 10*3/uL (ref 150–400)
RBC: 4.27 MIL/uL (ref 4.22–5.81)
RDW: 12 % (ref 11.5–15.5)
WBC: 5.6 10*3/uL (ref 4.0–10.5)
nRBC: 0 % (ref 0.0–0.2)

## 2022-03-08 LAB — BASIC METABOLIC PANEL
Anion gap: 6 (ref 5–15)
BUN: 11 mg/dL (ref 6–20)
CO2: 25 mmol/L (ref 22–32)
Calcium: 8.9 mg/dL (ref 8.9–10.3)
Chloride: 106 mmol/L (ref 98–111)
Creatinine, Ser: 1.2 mg/dL (ref 0.61–1.24)
GFR, Estimated: >60 mL/min (ref >60)
Glucose, Bld: 114 mg/dL — ABNORMAL HIGH (ref 70–99)
Potassium: 3.8 mmol/L (ref 3.5–5.1)
Sodium: 137 mmol/L (ref 135–145)

## 2022-03-08 LAB — TROPONIN I (HIGH SENSITIVITY)
Troponin I (High Sensitivity): 2 ng/L (ref <18)
Troponin I (High Sensitivity): 3 ng/L (ref <18)

## 2022-03-08 MED ORDER — ASPIRIN 81 MG PO CHEW
324.0000 mg | CHEWABLE_TABLET | Freq: Once | ORAL | Status: AC
  Administered 2022-03-08: 324 mg via ORAL
  Filled 2022-03-08: qty 4

## 2022-03-08 NOTE — ED Notes (Signed)
Lab tech at bedside drawing 2nd trop

## 2022-03-08 NOTE — ED Provider Notes (Signed)
[facility] EMERGENCY DEPARTMENT Provider Note   CSN: [id number] Arrival date & time: 03/08/22  1745     History {Add pertinent medical, surgical, social history, OB history to HPI:1} Chief Complaint  Patient presents with   Chest Pain    Ricky Wade is a 56 y.o. male.  He has a history of vertigo.  Said he started with some left upper chest pain radiating into his left shoulder this morning.  He said it went away for a little while but came back again and has been there steady for about an hour.  Ricky Wade it feels like pressure.  He also has some dizziness but he thinks that is his chronic vertigo.  No shortness of breath cough or weakness.  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  No known cardiac disease non-smoker.  Has tried nothing for it.  The history is provided by the patient.  Chest Pain Pain location:  L chest Pain quality: pressure   Pain radiates to:  L shoulder Pain severity:  Moderate Onset quality:  Gradual Duration:  12 hours Timing:  Intermittent Progression:  Unchanged Chronicity:  New Context: at rest   Relieved by:  None tried Worsened by:  Nothing Ineffective treatments:  None tried Associated symptoms: dizziness   Associated symptoms: no abdominal pain, no back pain, no cough, no diaphoresis, no fever, no nausea, no shortness of breath and no vomiting   Risk factors: no coronary artery disease, no high cholesterol, no hypertension, no prior DVT/PE and no smoking        Home Medications Prior to Admission medications   Medication Sig Start Date End Date Taking? Authorizing Provider  aspirin EC 81 MG tablet Take 81 mg by mouth daily.    [provider]  atorvastatin (LIPITOR) 10 MG tablet Take 10 mg by mouth at bedtime.     [provider]  naproxen (NAPROSYN) 500 MG tablet Take 1 tablet (500 mg total) by mouth 2 (two) times daily with a meal. 11/15/20   Sanjuana Kava, MD  pantoprazole (PROTONIX) 40 MG tablet  Take 1 tablet (40 mg total) by mouth daily. 02/27/18   Sherwood Gambler, MD      Allergies    Patient has no known allergies.    Review of Systems   Review of Systems  Constitutional:  Negative for diaphoresis and fever.  HENT:  Negative for sore throat.   Eyes:  Negative for visual disturbance.  Respiratory:  Negative for cough and shortness of breath.   Cardiovascular:  Positive for chest pain.  Gastrointestinal:  Negative for abdominal pain, nausea and vomiting.  Genitourinary:  Negative for dysuria.  Musculoskeletal:  Negative for back pain.  Skin:  Negative for rash.  Neurological:  Positive for dizziness.    Physical Exam Updated Vital Signs Pulse (!) 56   Temp 98.8 F (37.1 C) (Oral)   Resp 18   SpO2 99%  Physical Exam Vitals and nursing note reviewed.  Constitutional:      General: He is not in acute distress.    Appearance: He is well-developed.  HENT:     Head: Normocephalic and atraumatic.  Eyes:     Conjunctiva/sclera: Conjunctivae normal.  Cardiovascular:     Rate and Rhythm: Normal rate and regular rhythm.     Heart sounds: Normal heart sounds. No murmur heard. Pulmonary:     Effort: Pulmonary effort is normal. No respiratory distress.     Breath  sounds: Normal breath sounds.  Abdominal:     Palpations: Abdomen is soft.     Tenderness: There is no abdominal tenderness.  Musculoskeletal:        General: No swelling. Normal range of motion.     Cervical back: Neck supple.     Right lower leg: No tenderness. No edema.     Left lower leg: No tenderness. No edema.  Skin:    General: Skin is warm and dry.     Capillary Refill: Capillary refill takes less than 2 seconds.  Neurological:     General: No focal deficit present.     Mental Status: He is alert.     ED Results / Procedures / Treatments   Labs (all labs ordered are listed, but only abnormal results are displayed) Labs Reviewed - No data to display  EKG None  Radiology No results  found.  Procedures Procedures  {Document cardiac monitor, telemetry assessment procedure when appropriate:1}  Medications Ordered in ED Medications  aspirin chewable tablet 324 mg (has no administration in time range)    ED Course/ Medical Decision Making/ A&P                           Medical Decision Making Amount and/or Complexity of Data Reviewed Labs: ordered. Radiology: ordered.  Risk OTC drugs.   This patient complains of ***; this involves an extensive number of treatment Options and is a complaint that carries with it a high risk of complications and morbidity. The differential includes ***  I ordered, reviewed and interpreted labs, which included *** I ordered medication *** and reviewed PMP when indicated. I ordered imaging studies which included *** and I independently    visualized and interpreted imaging which showed *** Additional history obtained from *** Previous records obtained and reviewed *** I consulted *** and discussed lab and imaging findings and discussed disposition.  Cardiac monitoring reviewed, *** Social determinants considered, *** Critical Interventions: ***  After the interventions stated above, I reevaluated the patient and found *** Admission and further testing considered, ***   {Document critical care time when appropriate:1} {Document review of labs and clinical decision tools ie heart score, Chads2Vasc2 etc:1}  {Document your independent review of radiology images, and any outside records:1} {Document your discussion with family members, caretakers, and with consultants:1} {Document social determinants of health affecting pt's care:1} {Document your decision making why or why not admission, treatments were needed:1} Final Clinical Impression(s) / ED Diagnoses Final diagnoses:  None    Rx / DC Orders ED Discharge Orders     None

## 2022-03-08 NOTE — Discharge Instructions (Signed)
You are seen in the emergency department for some left-sided chest pain.  You had blood work EKG and chest x-ray that did not show an obvious explanation for your symptoms.  There is no evidence of heart attack.  Please continue your regular medications and follow-up with your primary care doctor.  Return to the emergency department if any worsening or concerning symptoms.

## 2022-03-08 NOTE — ED Triage Notes (Signed)
Pt reports chest pain that has been "on and off for 1 day." Pt reports the pain is worse when turning his head.

## 2022-07-04 ENCOUNTER — Other Ambulatory Visit (HOSPITAL_COMMUNITY): Payer: Self-pay | Admitting: Internal Medicine

## 2022-07-04 ENCOUNTER — Encounter: Payer: Self-pay | Admitting: Internal Medicine

## 2022-07-04 DIAGNOSIS — N5089 Other specified disorders of the male genital organs: Secondary | ICD-10-CM

## 2022-07-16 ENCOUNTER — Ambulatory Visit (HOSPITAL_COMMUNITY)
Admission: RE | Admit: 2022-07-16 | Discharge: 2022-07-16 | Disposition: A | Discharge status: Home / Self Care | Payer: BC Managed Care – PPO | Source: Ambulatory Visit | Attending: Internal Medicine | Admitting: Internal Medicine

## 2022-07-16 DIAGNOSIS — N5089 Other specified disorders of the male genital organs: Secondary | ICD-10-CM

## 2022-07-18 ENCOUNTER — Ambulatory Visit (HOSPITAL_COMMUNITY): Payer: BC Managed Care – PPO

## 2022-07-24 ENCOUNTER — Encounter: Payer: Self-pay | Admitting: Radiology

## 2022-09-15 ENCOUNTER — Encounter: Payer: Self-pay | Admitting: Urology

## 2022-09-15 ENCOUNTER — Ambulatory Visit: Payer: BC Managed Care – PPO | Admitting: Urology

## 2022-09-15 VITALS — BP 111/77 | HR 73

## 2022-09-15 DIAGNOSIS — N5089 Other specified disorders of the male genital organs: Secondary | ICD-10-CM

## 2022-09-15 LAB — URINALYSIS, ROUTINE W REFLEX MICROSCOPIC
Bilirubin, UA: NEGATIVE
Glucose, UA: NEGATIVE
Ketones, UA: NEGATIVE
Leukocytes,UA: NEGATIVE
Nitrite, UA: NEGATIVE
Protein,UA: NEGATIVE
Specific Gravity, UA: 1.02 (ref 1.005–1.030)
Urobilinogen, Ur: 0.2 mg/dL (ref 0.2–1.0)
pH, UA: 7 (ref 5.0–7.5)

## 2022-09-15 LAB — MICROSCOPIC EXAMINATION
Bacteria, UA: NONE SEEN
WBC, UA: NONE SEEN /hpf (ref 0–5)

## 2022-09-15 MED ORDER — MELOXICAM 7.5 MG PO TABS
7.5000 mg | ORAL_TABLET | Freq: Every day | ORAL | 3 refills | Status: AC
Start: 2022-09-15 — End: ?

## 2022-09-15 NOTE — Progress Notes (Unsigned)
09/15/2022 10:10 AM   Helene Shoe May 27, 1965 [id number]  Referring provider: Elfredia Nevins, MD [address] [place],  [place] [address]  Scrotal mass   HPI: Mr Griesinger is a 57yo here for evaluation of a right scrotal mass. Over 6 months ago he noted a right scrotal mass that is intermittently tender to palpation. He denies any scrotal/testicular trauma. He denies history of epididymal/testicular infections. The pain is dull, intermittent mild to moderate and nonraditing. Pain is not worsened with activity. Pain is exacerbating by sitting for extended periods of time. NO significant LUTS   PMH: Past Medical History:  Diagnosis Date   High cholesterol     Surgical History: Past Surgical History:  Procedure Laterality Date   COLONOSCOPY  02/10/2012   Procedure: COLONOSCOPY;  Surgeon: West Bali, MD;  Location: [facility];  Service: Endoscopy;  Laterality: N/A;  1:10   lipoma removal      Home Medications:  Allergies as of 09/15/2022   No Known Allergies      Medication List        Accurate as of September 15, 2022 10:10 AM. If you have any questions, ask your nurse or doctor.          aspirin EC 81 MG tablet Take 81 mg by mouth daily.   atorvastatin 10 MG tablet Commonly known as: LIPITOR Take 10 mg by mouth at bedtime.   cephALEXin 500 MG capsule Commonly known as: KEFLEX Take 500 mg by mouth every 6 (six) hours.   ciprofloxacin 500 MG tablet Commonly known as: CIPRO Take 500 mg by mouth 2 (two) times daily.   naproxen 500 MG tablet Commonly known as: NAPROSYN Take 1 tablet (500 mg total) by mouth 2 (two) times daily with a meal.   pantoprazole 40 MG tablet Commonly known as: Protonix Take 1 tablet (40 mg total) by mouth daily.        Allergies: No Known Allergies  Family History: Family History  Problem Relation Age of Onset   Diabetes Mother    Pancreatic cancer Mother    Diabetes Father    Dementia Father    Colon cancer  Neg Hx     Social History:  reports that he has never smoked. He has never used smokeless tobacco. He reports that he does not drink alcohol and does not use drugs.  ROS: All other review of systems were reviewed and are negative except what is noted above in HPI  Physical Exam: BP 111/77   Pulse 73   Constitutional:  Alert and oriented, No acute distress. HEENT: [place] AT, moist mucus membranes.  Trachea midline, no masses. Cardiovascular: No clubbing, cyanosis, or edema. Respiratory: Normal respiratory effort, no increased work of breathing. GI: Abdomen is soft, nontender, nondistended, no abdominal masses GU: No CVA tenderness. Circumcised phallus. No masses/lesions on penis, testis, scrotum. 1.5cm right epididymal head cyst, nontender Lymph: No cervical or inguinal lymphadenopathy. Skin: No rashes, bruises or suspicious lesions. Neurologic: Grossly intact, no focal deficits, moving all 4 extremities. Psychiatric: Normal mood and affect.  Laboratory Data: Lab Results  Component Value Date   WBC 5.6 03/08/2022   HGB 13.6 03/08/2022   HCT 39.7 03/08/2022   MCV 93.0 03/08/2022   PLT 291 03/08/2022    Lab Results  Component Value Date   CREATININE 1.20 03/08/2022    No results found for: "PSA"  No results found for: "TESTOSTERONE"  No results found for: "HGBA1C"  Urinalysis No results found for: "  COLORURINE", "APPEARANCEUR", "LABSPEC", "PHURINE", "GLUCOSEU", "HGBUR", "BILIRUBINUR", "KETONESUR", "PROTEINUR", "UROBILINOGEN", "NITRITE", "LEUKOCYTESUR"  No results found for: "LABMICR", "WBCUA", "RBCUA", "LABEPIT", "MUCUS", "BACTERIA"  Pertinent Imaging: Scrotal US 07/16/2022: Images reviewed and discussed with the patient  Results for orders placed during the hospital encounter of 03/26/04  DG Abd 1 View  Narrative Clinical data: Rt ureteral calculus. ONE VIEW ABDOMEN: Comparison 03/25/04 CT of the abdomen and pelvis. There is a 5 mm in size distal right ureteral  calculus located in the region of the right ureterovesical junction. The bowel gas pattern is normal.  Impression 5 mm in size distal right ureteral calculus located at the level of the right ureterovesical junction.  Provider: Kyra Searles  No results found for this or any previous visit.  No results found for this or any previous visit.  No results found for this or any previous visit.  No results found for this or any previous visit.  No valid procedures specified. No results found for this or any previous visit.  No results found for this or any previous visit.   Assessment & Plan:    1. Epididymal cysts -We discussed the benign nature of epididymal cysts. We discussed the management options including NSAIDS, scrotal support and, if the cyst continues to enlarge, epididymal cyst excision. We have elected to pursue NSAID and scrotal support - Urinalysis, Routine w reflex microscopic   No follow-ups on file.  Wilkie Aye, MD  [facility] [place]

## 2022-09-15 NOTE — Progress Notes (Unsigned)
Patient requested a work note. Per Dr. Ronne Binning, patient is fully released to go back to work with no restrictions.   Patient notified and voiced understanding and work note given.

## 2022-09-16 ENCOUNTER — Encounter: Payer: Self-pay | Admitting: Urology

## 2022-11-14 ENCOUNTER — Ambulatory Visit: Payer: BC Managed Care – PPO | Admitting: Urology

## 2022-11-14 DIAGNOSIS — N5089 Other specified disorders of the male genital organs: Secondary | ICD-10-CM

## 2023-01-21 ENCOUNTER — Ambulatory Visit: Payer: BC Managed Care – PPO | Admitting: Urology

## 2023-01-21 VITALS — BP 127/74 | HR 54

## 2023-01-21 DIAGNOSIS — N5089 Other specified disorders of the male genital organs: Secondary | ICD-10-CM

## 2023-01-21 DIAGNOSIS — Z09 Encounter for follow-up examination after completed treatment for conditions other than malignant neoplasm: Secondary | ICD-10-CM | POA: Diagnosis not present

## 2023-01-21 DIAGNOSIS — Z85528 Personal history of other malignant neoplasm of kidney: Secondary | ICD-10-CM

## 2023-01-21 NOTE — Patient Instructions (Signed)
Epididymitis  Epididymitis is inflammation or swelling of the epididymis. This is caused by an infection. The epididymis is a cord-like structure that is located along the top and back part of the testicle. It collects and stores sperm from the testicle. This condition can also cause pain and swelling of the testicle and scrotum. Symptoms usually start suddenly (acute epididymitis). Sometimes epididymitis starts gradually and lasts for a while (chronic epididymitis). Chronic epididymitis may be harder to treat. What are the causes? In men ages 1-40, this condition is usually caused by a bacterial infection or a sexually transmitted infection (STI), such as gonorrhea or chlamydia. In men 33 and older, this condition is usually caused by bacteria from a urinary blockage or from abnormalities in the urinary system. These can result from: Having a tube placed into the bladder (urinary catheter). Having an enlarged or inflamed prostate gland. Having recently had urinary tract surgery. Having a problem with a backward flow of urine (retrograde). In men who have a condition that weakens the body's defense system (immune system), such as human immunodeficiency virus (HIV), this condition can be caused by: Other bacteria, including tuberculosis and syphilis. Viruses. Fungi. Sometimes this condition occurs without infection. This may happen because of trauma or repetitive activities such as sports. What increases the risk? You are more likely to develop this condition if you have: Unprotected sex with more than one partner. Anal sex. Had recent surgery. A urinary catheter. Urinary problems. A suppressed immune system. What are the signs or symptoms? This condition usually begins suddenly with chills, fever, and pain behind the scrotum and in the testicle. Other symptoms include: Swelling of the scrotum, testicle, or both. Pain when ejaculating or urinating. Pain in the back or  abdomen. Nausea. Itching and discharge from the penis. A frequent need to pass urine. Redness, increased warmth, and tenderness of the scrotum. How is this diagnosed? Your health care provider can diagnose this condition based on your symptoms and medical history. Your health care provider will also do a physical exam to check your scrotum and testicle for swelling, pain, and redness. You may also have other tests, including: Testing of discharge from the penis. Testing your urine for infections, such as STIs. Ultrasound to check for blood flow and inflammation. Your health care provider may test you for other STIs, including HIV. How is this treated? Treatment for this condition depends on the cause. If your condition is caused by a bacterial infection, oral antibiotic medicine may be prescribed. If the bacterial infection has spread to your blood, you may need to receive IV antibiotics. For both bacterial and nonbacterial epididymitis, you may be treated with: Rest. Elevation of the scrotum. Pain medicines. Anti-inflammatory medicines. Surgery may be needed if: You have pus buildup in the scrotum (abscess). You have epididymitis that has not responded to other treatments. Follow these instructions at home: Medicines Take over-the-counter and prescription medicines only as told by your health care provider. If you were prescribed an antibiotic medicine, take it as told by your health care provider. Do not stop taking the antibiotic even if your condition improves. Sexual activity If your epididymitis was caused by an STI, avoid sexual activity until your treatment is complete. Inform your sexual partner or partners if you test positive for an STI. They may need to be treated. Do not engage in sexual activity with your partner or partners until their treatment is completed. Managing pain and swelling  If directed, raise (elevate) your scrotum and apply ice.  To do this: Put ice in a  plastic bag. Place a small towel or pillow between your legs. Rest your scrotum on the pillow or towel. Place another towel between your skin and the plastic bag. Leave the ice on for 20 minutes, 2-3 times a day. Remove the ice if your skin turns bright red. This is very important. If you cannot feel pain, heat, or cold, you have a greater risk of damage to the area. Keep your scrotum elevated and supported while resting. Ask your health care provider if you should wear a scrotal support, such as a jockstrap. Wear it as told by your health care provider. Try taking a sitz bath to help with discomfort. This is a warm water bath that is taken while you are sitting down. The water should come up to your hips and should cover your buttocks. Do this 3-4 times per day or as told by your health care provider. General instructions Drink enough fluid to keep your urine pale yellow. Return to your normal activities as told by your health care provider. Ask your health care provider what activities are safe for you. Keep all follow-up visits. This is important. Contact a health care provider if: You have a fever. Your pain medicine is not helping. Your pain is getting worse. Your symptoms do not improve within 3 days. Summary Epididymitis is inflammation or swelling of the epididymis. This is caused by an infection. This condition can also cause pain and swelling of the testicle and scrotum. Treatment for this condition depends on the cause. If your condition is caused by a bacterial infection, oral antibiotic medicine may be prescribed. Inform your sexual partner or partners if you test positive for an STI. They may need to be treated. Do not engage in sexual activity with your partner or partners until their treatment is completed. Contact a health care provider if your symptoms do not improve within 3 days. This information is not intended to replace advice given to you by your health care provider.  Make sure you discuss any questions you have with your health care provider. Document Revised: 12/19/2020 Document Reviewed: 12/19/2020 Elsevier Patient Education  2024 [employer].

## 2023-01-21 NOTE — Progress Notes (Unsigned)
01/21/2023 1:33 PM   Ricky Wade 01-May-1966 [id number]  Referring provider: [facility] [address] [place],  [place] [address]  Followup testicular pain   HPI: Mr Ricky Wade is a 57yo here for followup for right testicular pain.He took meloxicam which improved his right testis pain. Pain occurs intermittently with severe straining. No other complaints    PMH: Past Medical History:  Diagnosis Date   High cholesterol     Surgical History: Past Surgical History:  Procedure Laterality Date   COLONOSCOPY  02/10/2012   Procedure: COLONOSCOPY;  Surgeon: West Bali, MD;  Location: [facility];  Service: Endoscopy;  Laterality: N/A;  1:10   lipoma removal      Home Medications:  Allergies as of 01/21/2023   No Known Allergies      Medication List        Accurate as of January 21, 2023  1:33 PM. If you have any questions, ask your nurse or doctor.          aspirin EC 81 MG tablet Take 81 mg by mouth daily.   atorvastatin 10 MG tablet Commonly known as: LIPITOR Take 10 mg by mouth at bedtime.   cephALEXin 500 MG capsule Commonly known as: KEFLEX Take 500 mg by mouth every 6 (six) hours.   ciprofloxacin 500 MG tablet Commonly known as: CIPRO Take 500 mg by mouth 2 (two) times daily.   meloxicam 7.5 MG tablet Commonly known as: Mobic Take 1 tablet (7.5 mg total) by mouth daily.   naproxen 500 MG tablet Commonly known as: NAPROSYN Take 1 tablet (500 mg total) by mouth 2 (two) times daily with a meal.   pantoprazole 40 MG tablet Commonly known as: Protonix Take 1 tablet (40 mg total) by mouth daily.        Allergies: No Known Allergies  Family History: Family History  Problem Relation Age of Onset   Diabetes Mother    Pancreatic cancer Mother    Diabetes Father    Dementia Father    Colon cancer Neg Hx     Social History:  reports that he has never smoked. He has never used smokeless tobacco. He reports  that he does not drink alcohol and does not use drugs.  ROS: All other review of systems were reviewed and are negative except what is noted above in HPI  Physical Exam: BP 127/74   Pulse (!) 54   Constitutional:  Alert and oriented, No acute distress. HEENT: [place] AT, moist mucus membranes.  Trachea midline, no masses. Cardiovascular: No clubbing, cyanosis, or edema. Respiratory: Normal respiratory effort, no increased work of breathing. GI: Abdomen is soft, nontender, nondistended, no abdominal masses GU: No CVA tenderness.  Lymph: No cervical or inguinal lymphadenopathy. Skin: No rashes, bruises or suspicious lesions. Neurologic: Grossly intact, no focal deficits, moving all 4 extremities. Psychiatric: Normal mood and affect.  Laboratory Data: Lab Results  Component Value Date   WBC 5.6 03/08/2022   HGB 13.6 03/08/2022   HCT 39.7 03/08/2022   MCV 93.0 03/08/2022   PLT 291 03/08/2022    Lab Results  Component Value Date   CREATININE 1.20 03/08/2022    No results found for: "PSA"  No results found for: "TESTOSTERONE"  No results found for: "HGBA1C"  Urinalysis    Component Value Date/Time   APPEARANCEUR Clear 09/15/2022 0953   GLUCOSEU Negative 09/15/2022 0953   BILIRUBINUR Negative 09/15/2022 0953   PROTEINUR Negative 09/15/2022 0953  NITRITE Negative 09/15/2022 0953   LEUKOCYTESUR Negative 09/15/2022 4782    Lab Results  Component Value Date   LABMICR See below: 09/15/2022   WBCUA None seen 09/15/2022   LABEPIT 0-10 09/15/2022   BACTERIA None seen 09/15/2022    Pertinent Imaging:  Results for orders placed during the hospital encounter of 03/26/04  DG Abd 1 View  Narrative Clinical data: Rt ureteral calculus. ONE VIEW ABDOMEN: Comparison 03/25/04 CT of the abdomen and pelvis. There is a 5 mm in size distal right ureteral calculus located in the region of the right ureterovesical junction. The bowel gas pattern is normal.  Impression 5 mm in size  distal right ureteral calculus located at the level of the right ureterovesical junction.  Provider: Kyra Wade  No results found for this or any previous visit.  No results found for this or any previous visit.  No results found for this or any previous visit.  No results found for this or any previous visit.  No valid procedures specified. No results found for this or any previous visit.  No results found for this or any previous visit.   Assessment & Plan:    1. Scrotal pain -resolved. Followup prn - Urinalysis, Routine w reflex microscopic   No follow-ups on file.  Ricky Aye, MD  [facility] [place]

## 2023-01-22 ENCOUNTER — Encounter: Payer: Self-pay | Admitting: Urology

## 2023-04-30 ENCOUNTER — Other Ambulatory Visit: Payer: Self-pay

## 2023-04-30 ENCOUNTER — Emergency Department (HOSPITAL_COMMUNITY): Payer: BC Managed Care – PPO

## 2023-04-30 ENCOUNTER — Emergency Department (HOSPITAL_COMMUNITY)
Admission: EM | Admit: 2023-04-30 | Discharge: 2023-04-30 | Disposition: A | Discharge status: Home / Self Care | Payer: BC Managed Care – PPO | Attending: Emergency Medicine | Admitting: Emergency Medicine

## 2023-04-30 ENCOUNTER — Encounter (HOSPITAL_COMMUNITY): Payer: Self-pay | Admitting: Emergency Medicine

## 2023-04-30 DIAGNOSIS — M79604 Pain in right leg: Secondary | ICD-10-CM | POA: Diagnosis present

## 2023-04-30 MED ORDER — NAPROXEN 500 MG PO TABS: 500.0000 mg | ORAL_TABLET | Freq: Two times a day (BID) | ORAL | 0 refills | Status: AC

## 2023-04-30 MED ORDER — PREDNISONE 20 MG PO TABS: 40.0000 mg | ORAL_TABLET | Freq: Every day | ORAL | 0 refills | Status: AC

## 2023-04-30 MED ORDER — ACETAMINOPHEN 325 MG PO TABS
650.0000 mg | ORAL_TABLET | Freq: Once | ORAL | Status: AC
  Administered 2023-04-30: 650 mg via ORAL
  Filled 2023-04-30: qty 2

## 2023-04-30 NOTE — ED Provider Notes (Signed)
[place] EMERGENCY DEPARTMENT AT [facility] Provider Note   CSN: [id number] Arrival date & time: 04/30/23  2440     History  Chief Complaint  Patient presents with   Leg Pain    Ricky Wade is a 57 y.o. male with a past medical history significant for hyperlipidemia who presents to the ED due to right calf pain that radiates up the posterior aspect of his right lower extremity for the past few days.  Patient notes he is concerned about a possible blood clot.  No previous history of blood clots.  Denies chest pain and shortness of breath.  No injury to right lower extremity.  Denies low back pain.  Denies numbness/tingling.  No weakness.  Denies saddle anesthesia. Denies edema to RLE.   History obtained from patient and past medical records. No interpreter used during encounter.       Home Medications Prior to Admission medications   Medication Sig Start Date End Date Taking? Authorizing Provider  naproxen (NAPROSYN) 500 MG tablet Take 1 tablet (500 mg total) by mouth 2 (two) times daily. 04/30/23  Yes Carron Mcmurry, Merla Riches, PA-C  predniSONE (DELTASONE) 20 MG tablet Take 2 tablets (40 mg total) by mouth daily for 5 days. 04/30/23 05/05/23 Yes Ransome Helwig, Merla Riches, PA-C  aspirin EC 81 MG tablet Take 81 mg by mouth daily.    [provider]  atorvastatin (LIPITOR) 10 MG tablet Take 10 mg by mouth at bedtime.     [provider]  cephALEXin (KEFLEX) 500 MG capsule Take 500 mg by mouth every 6 (six) hours. Patient not taking: Reported on 09/15/2022 09/04/22   [provider]  ciprofloxacin (CIPRO) 500 MG tablet Take 500 mg by mouth 2 (two) times daily. Patient not taking: Reported on 09/15/2022 08/25/22   [provider]  meloxicam (MOBIC) 7.5 MG tablet Take 1 tablet (7.5 mg total) by mouth daily. 09/15/22   McKenzie, Mardene Celeste, MD  naproxen (NAPROSYN) 500 MG tablet Take 1 tablet (500 mg total) by mouth 2 (two) times daily with a meal. 11/15/20    Darreld Mclean, MD  pantoprazole (PROTONIX) 40 MG tablet Take 1 tablet (40 mg total) by mouth daily. 02/27/18   Pricilla Loveless, MD      Allergies    Patient has no known allergies.    Review of Systems   Review of Systems  Respiratory:  Negative for shortness of breath.   Cardiovascular:  Negative for chest pain and leg swelling.  Musculoskeletal:  Positive for arthralgias. Negative for back pain.    Physical Exam Updated Vital Signs BP 125/78 (BP Location: Right Arm)   Pulse (!) 55   Temp 98.7 F (37.1 C) (Oral)   Resp 17   SpO2 97%  Physical Exam Vitals and nursing note reviewed.  Constitutional:      General: He is not in acute distress.    Appearance: He is not ill-appearing.  HENT:     Head: Normocephalic.  Eyes:     Pupils: Pupils are equal, round, and reactive to light.  Cardiovascular:     Rate and Rhythm: Normal rate and regular rhythm.     Pulses: Normal pulses.     Heart sounds: Normal heart sounds. No murmur heard.    No friction rub. No gallop.  Pulmonary:     Effort: Pulmonary effort is normal.     Breath sounds: Normal breath sounds.  Abdominal:     General: Abdomen is flat. There is  no distension.     Palpations: Abdomen is soft.     Tenderness: There is no abdominal tenderness. There is no guarding or rebound.  Musculoskeletal:        General: Normal range of motion.     Cervical back: Neck supple.     Comments: No lower extremity edema. No calf tenderness.  Skin:    General: Skin is warm and dry.  Neurological:     General: No focal deficit present.     Mental Status: He is alert.  Psychiatric:        Mood and Affect: Mood normal.        Behavior: Behavior normal.     ED Results / Procedures / Treatments   Labs (all labs ordered are listed, but only abnormal results are displayed) Labs Reviewed - No data to display  EKG None  Radiology US Venous Img Lower Right (DVT Study)  Result Date: 04/30/2023 CLINICAL DATA:  Right lower  extremity pain.  Evaluate for DVT. EXAM: RIGHT LOWER EXTREMITY VENOUS DOPPLER ULTRASOUND TECHNIQUE: Gray-scale sonography with graded compression, as well as color Doppler and duplex ultrasound were performed to evaluate the lower extremity deep venous systems from the level of the common femoral vein and including the common femoral, femoral, profunda femoral, popliteal and calf veins including the posterior tibial, peroneal and gastrocnemius veins when visible. The superficial great saphenous vein was also interrogated. Spectral Doppler was utilized to evaluate flow at rest and with distal augmentation maneuvers in the common femoral, femoral and popliteal veins. COMPARISON:  None Available. FINDINGS: Contralateral Common Femoral Vein: Respiratory phasicity is normal and symmetric with the symptomatic side. No evidence of thrombus. Normal compressibility. Common Femoral Vein: No evidence of thrombus. Normal compressibility, respiratory phasicity and response to augmentation. Saphenofemoral Junction: No evidence of thrombus. Normal compressibility and flow on color Doppler imaging. Profunda Femoral Vein: No evidence of thrombus. Normal compressibility and flow on color Doppler imaging. Femoral Vein: No evidence of thrombus. Normal compressibility, respiratory phasicity and response to augmentation. Popliteal Vein: No evidence of thrombus. Normal compressibility, respiratory phasicity and response to augmentation. Calf Veins: No evidence of thrombus. Normal compressibility and flow on color Doppler imaging. Superficial Great Saphenous Vein: No evidence of thrombus. Normal compressibility. Other Findings:  None. IMPRESSION: No evidence of DVT within the right lower extremity. Electronically Signed   By: Simonne Come M.D.   On: 04/30/2023 11:49    Procedures Procedures    Medications Ordered in ED Medications  acetaminophen (TYLENOL) tablet 650 mg (650 mg Oral Given 04/30/23 1120)    ED Course/ Medical  Decision Making/ A&P                                 Medical Decision Making Amount and/or Complexity of Data Reviewed Radiology: ordered and independent interpretation performed. Decision-making details documented in ED Course.  Risk OTC drugs. Prescription drug management.   This patient presents to the ED for concern of RLE pain, this involves an extensive number of treatment options, and is a complaint that carries with it a high risk of complications and morbidity.  The differential diagnosis includes DVT, lumbar radiculopathy, fracture, etc  57 year old male presents to the ED due to right calf pain that radiates up the posterior aspect of his right lower extremity for the past few days.  Patient concerned about a possible DVT.  No history of blood clots.  Denies chest pain  and shortness of breath.  No injury.  Denies low back pain.  Denies saddle anesthesia, bowel/bladder incontinence, lower extremity numbness/tingling, lower extremity weakness.  Upon arrival, stable vitals.  Patient in no acute distress.  No lower extremity edema.  Right lower extremity neurovascularly intact with soft compartments.  Low suspicion for compartment syndrome.  No calf tenderness.  Ultrasound to rule out DVT.  If unremarkable possible radicular symptoms?  No injury to suggest bony fracture so we will hold off on x-ray at this time. Tylenol given.   [place] negative for DVT. RLE neurovascularly intact. Possible radicular symptoms. Patient discharged with steroids and pain medication. Advised patient to follow-up with PCP if symptoms do not improve over the next few days. Strict ED precautions discussed with patient. Patient states understanding and agrees to plan. Patient discharged home in no acute distress and stable vitals  Has PCP Hx hyperlipidemia       Final Clinical Impression(s) / ED Diagnoses Final diagnoses:  Right leg pain    Rx / DC Orders ED Discharge Orders          Ordered     predniSONE (DELTASONE) 20 MG tablet  Daily        04/30/23 1155    naproxen (NAPROSYN) 500 MG tablet  2 times daily        04/30/23 1155              Jesusita Oka 04/30/23 1216    Pricilla Loveless, MD 05/06/23 [phone]

## 2023-04-30 NOTE — Discharge Instructions (Addendum)
It was a pleasure taking care of you today.  As discussed, your ultrasound did not show evidence of a blood clot.  I am sending you home with steroids and pain medication.  Take steroids for the next 5 days.  Take pain medication as needed.  Please follow-up with PCP if symptoms do not improve over the next few days.  Return to the ER for any worsening symptoms.

## 2023-04-30 NOTE — ED Triage Notes (Signed)
Pt c/o sudden pain last night to right calf and lateral leg from knee down. Denies injury. Worse with walking or trying to sleep. No swelling/redness/warmth noted upon assessment. Nad.

## 2023-07-28 ENCOUNTER — Ambulatory Visit (HOSPITAL_COMMUNITY)
Admission: RE | Admit: 2023-07-28 | Discharge: 2023-07-28 | Disposition: A | Discharge status: Home / Self Care | Source: Ambulatory Visit | Attending: Family Medicine | Admitting: Family Medicine

## 2023-07-28 ENCOUNTER — Other Ambulatory Visit (HOSPITAL_COMMUNITY): Payer: Self-pay | Admitting: Family Medicine

## 2023-07-28 DIAGNOSIS — R053 Chronic cough: Secondary | ICD-10-CM | POA: Insufficient documentation

## 2024-03-29 ENCOUNTER — Ambulatory Visit
Admission: EM | Admit: 2024-03-29 | Discharge: 2024-03-29 | Disposition: A | Discharge status: Home / Self Care | Attending: Nurse Practitioner | Admitting: Nurse Practitioner

## 2024-03-29 DIAGNOSIS — Z87898 Personal history of other specified conditions: Secondary | ICD-10-CM | POA: Diagnosis not present

## 2024-03-29 DIAGNOSIS — R42 Dizziness and giddiness: Secondary | ICD-10-CM

## 2024-03-29 MED ORDER — MECLIZINE HCL 12.5 MG PO TABS: 12.5000 mg | ORAL_TABLET | Freq: Three times a day (TID) | ORAL | 0 refills | Status: AC | PRN

## 2024-03-29 NOTE — ED Provider Notes (Signed)
 [facility]    CSN: [id number] Arrival date & time: 03/29/24  1529      History   Chief Complaint Chief Complaint  Patient presents with   Dizziness    HPI Ricky Wade is a 58 y.o. male.   The history is provided by the patient.   Patient presents for complaints of dizziness that started this morning after awakening.  Patient states that prior to his symptoms starting, he was at the dentist yesterday and had to keep his head turned to the left side the entire time.  He states while he was having his dental exam, he did experience symptoms.  Today when he woke up, he states his symptoms continue to persist.  He states symptoms worsen when he turns his head to the left side.  He states that he also feels like the room is spinning.  Patient denies blurred vision, numbness, tingling, lower extremity weakness, nausea, or vomiting.  Patient states that he did take Dramamine for his symptoms.  States that he did see rehab for his vertigo in the past. 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 Authorizing Provider  aspirin  EC 81 MG tablet Take 81 mg by mouth daily.    [provider]  atorvastatin (LIPITOR) 10 MG tablet Take 10 mg by mouth at bedtime.     [provider]  cephALEXin (KEFLEX) 500 MG capsule Take 500 mg by mouth every 6 (six) hours. Patient not taking: Reported on 09/15/2022  09/04/22   [provider]  ciprofloxacin (CIPRO) 500 MG tablet Take 500 mg by mouth 2 (two) times daily. Patient not taking: Reported on 09/15/2022 08/25/22   [provider]  meloxicam  (MOBIC ) 7.5 MG tablet Take 1 tablet (7.5 mg total) by mouth daily. 09/15/22   McKenzie, Belvie LITTIE, MD  naproxen  (NAPROSYN ) 500 MG tablet Take 1 tablet (500 mg total) by mouth 2 (two) times daily with a meal. 11/15/20   Brenna Lin, MD  naproxen  (NAPROSYN ) 500 MG tablet Take 1 tablet (500 mg total) by mouth 2 (two) times daily. 04/30/23   Aberman, Caroline C, PA-C  pantoprazole  (PROTONIX ) 40 MG tablet Take 1 tablet (40 mg total) by mouth daily. 02/27/18   Freddi Hamilton, MD    Family History Family History  Problem Relation Age of Onset   Diabetes Mother    Pancreatic cancer Mother    Diabetes Father    Dementia Father    Colon cancer Neg Hx     Social History Social History   Tobacco Use   Smoking status: Never   Smokeless tobacco: Never  Vaping Use   Vaping status: Never Used  Substance Use Topics   Alcohol use: No   Drug use: No     Allergies   Patient has no known allergies.   Review of Systems Review of Systems Per  HPI  Physical Exam Triage Vital Signs ED Triage Vitals  Encounter Vitals Group     BP 03/29/24 1535 (!) 132/90     Girls Systolic BP Percentile --      Girls Diastolic BP Percentile --      Boys Systolic BP Percentile --      Boys Diastolic BP Percentile --      Pulse Rate 03/29/24 1535 74     Resp 03/29/24 1535 18     Temp 03/29/24 1535 98.5 F (36.9 C)     Temp Source 03/29/24 1535 Oral     SpO2 03/29/24 1535 98 %     Weight --      Height --      Head Circumference --      Peak Flow --      Pain Score 03/29/24 1537 0     Pain Loc --      Pain Education --      Exclude from Growth Chart --    Orthostatic VS for the past 24 hrs:  BP- Lying Pulse- Lying BP- Sitting Pulse- Sitting BP- Standing at 0 minutes Pulse- Standing at 0 minutes   03/29/24 1538 109/73 59 121/82 60 128/89 77    Updated Vital Signs BP (!) 132/90 (BP Location: Right Arm)   Pulse 74   Temp 98.5 F (36.9 C) (Oral)   Resp 18   SpO2 98%   Visual Acuity Right Eye Distance:   Left Eye Distance:   Bilateral Distance:    Right Eye Near:   Left Eye Near:    Bilateral Near:     Physical Exam Vitals and nursing note reviewed.  Constitutional:      General: He is not in acute distress.    Appearance: Normal appearance.  HENT:     Head: Normocephalic.  Eyes:     Extraocular Movements: Extraocular movements intact.     Pupils: Pupils are equal, round, and reactive to light.  Cardiovascular:     Rate and Rhythm: Normal rate and regular rhythm.     Pulses: Normal pulses.     Heart sounds: Normal heart sounds.  Pulmonary:     Effort: Pulmonary effort is normal.     Breath sounds: Normal breath sounds.  Abdominal:     General: Bowel sounds are normal.     Palpations: Abdomen is soft.  Musculoskeletal:     Cervical back: Normal range of motion.  Skin:    General: Skin is warm and dry.  Neurological:     General: No focal deficit present.     Mental Status: He is alert and oriented to person, place, and time.     GCS: GCS eye subscore is 4. GCS verbal subscore is 5. GCS motor subscore is 6.     Cranial Nerves: Cranial nerves 2-12 are intact.     Sensory: Sensation is intact.     Motor: Motor function is intact.     Coordination: Coordination is intact.     Gait: Gait is intact.  Psychiatric:        Mood and Affect: Mood normal.        Behavior: Behavior normal.      UC Treatments / Results  Labs (all labs ordered are listed, but only abnormal results are displayed) Labs Reviewed - No data to display  EKG   Radiology No results found.  Procedures Procedures (including critical care time)  Medications Ordered in UC Medications - No data  to display  Initial Impression / Assessment and Plan / UC Course  I have reviewed the  triage vital signs and the nursing notes.  Pertinent labs & imaging results that were available during my care of the patient were reviewed by me and considered in my medical decision making (see chart for details).  The patient is well-appearing, he is in no acute distress, vital signs are stable.  Neurological exam is within normal limits, no deficits noted.  Symptoms are consistent with vertigo.  Orthostatic vital signs were negative.  Will treat with meclizine  12.5 mg for vertigo symptoms.  Patient advised to follow-up with his PCP to see if he can send another referral for patient to see rehab for his vertigo.  Supportive care recommendations were provided discussed with the patient to include fluids, rest, avoiding sudden movement, and to monitor for signs of worsening.  Patient was also given strict ER follow-up precautions.  Patient was in agreement with this plan of care and verbalizes understanding.  All questions were answered.  Patient stable for discharge.  Final Clinical Impressions(s) / UC Diagnoses   Final diagnoses:  None   Discharge Instructions   None    ED Prescriptions   None    PDMP not reviewed this encounter.   Gilmer Etta PARAS, NP 03/29/24 [phone]

## 2024-03-29 NOTE — Discharge Instructions (Signed)
 Take medication as prescribed. You may take over-the-counter Tylenol  as needed for pain, fever, or general discomfort. Increase fluids and allow for plenty of rest. Avoid sudden movement.  Make sure you are resting before you get up to move around. Go to the emergency department if you experience worsening dizziness, blurred vision, headache, or change in your mental status. As discussed, please follow-up with your primary care physician to discuss referral to rehab for your vertigo. Follow-up as needed.

## 2024-03-29 NOTE — ED Triage Notes (Signed)
 Pt reports he has been feeling dizzy when he looks the the left side since 5am this morning.    Took drahamimine
